# Patient Record
Sex: Female | Born: 1985 | Race: White | Hispanic: No | Marital: Married | Smoking: Never smoker
Health system: Southern US, Community
[De-identification: ages and names within clinical notes are randomized; demographics above are authoritative.]

## PROBLEM LIST (undated history)

## (undated) ENCOUNTER — Inpatient Hospital Stay (HOSPITAL_COMMUNITY): Payer: Self-pay

## (undated) DIAGNOSIS — J45909 Unspecified asthma, uncomplicated: Secondary | ICD-10-CM

## (undated) DIAGNOSIS — L509 Urticaria, unspecified: Secondary | ICD-10-CM

## (undated) HISTORY — PX: WISDOM TOOTH EXTRACTION: SHX21

## (undated) HISTORY — DX: Urticaria, unspecified: L50.9

## (undated) HISTORY — PX: BREAST SURGERY: SHX581

## (undated) HISTORY — DX: Unspecified asthma, uncomplicated: J45.909

---

## 2004-03-19 HISTORY — PX: AUGMENTATION MAMMAPLASTY: SUR837

## 2014-03-15 ENCOUNTER — Emergency Department: Payer: Self-pay | Admitting: Emergency Medicine

## 2014-03-15 LAB — COMPREHENSIVE METABOLIC PANEL
ALK PHOS: 63 U/L
ANION GAP: 8 (ref 7–16)
Albumin: 4.1 g/dL (ref 3.4–5.0)
BUN: 12 mg/dL (ref 7–18)
Bilirubin,Total: 0.3 mg/dL (ref 0.2–1.0)
CALCIUM: 9 mg/dL (ref 8.5–10.1)
CO2: 26 mmol/L (ref 21–32)
Chloride: 104 mmol/L (ref 98–107)
Creatinine: 0.6 mg/dL (ref 0.60–1.30)
EGFR (African American): >60
Glucose: 101 mg/dL — ABNORMAL HIGH (ref 65–99)
Osmolality: 276 (ref 275–301)
Potassium: 3.6 mmol/L (ref 3.5–5.1)
SGOT(AST): 25 U/L (ref 15–37)
SGPT (ALT): 33 U/L
Sodium: 138 mmol/L (ref 136–145)
TOTAL PROTEIN: 7.8 g/dL (ref 6.4–8.2)

## 2014-03-15 LAB — CBC WITH DIFFERENTIAL/PLATELET
Basophil #: 0 10*3/uL (ref 0.0–0.1)
Basophil %: 0.3 %
Eosinophil #: 0 10*3/uL (ref 0.0–0.7)
Eosinophil %: 0.1 %
HCT: 40.6 % (ref 35.0–47.0)
HGB: 13.6 g/dL (ref 12.0–16.0)
Lymphocyte #: 0.9 10*3/uL — ABNORMAL LOW (ref 1.0–3.6)
Lymphocyte %: 5.4 %
MCH: 30.9 pg (ref 26.0–34.0)
MCHC: 33.7 g/dL (ref 32.0–36.0)
MCV: 92 fL (ref 80–100)
Monocyte #: 1.5 x10 3/mm — ABNORMAL HIGH (ref 0.2–0.9)
Monocyte %: 9.3 %
Neutrophil #: 13.5 10*3/uL — ABNORMAL HIGH (ref 1.4–6.5)
Neutrophil %: 84.9 %
Platelet: 273 10*3/uL (ref 150–440)
RBC: 4.42 10*6/uL (ref 3.80–5.20)
RDW: 11.7 % (ref 11.5–14.5)
WBC: 15.9 10*3/uL — ABNORMAL HIGH (ref 3.6–11.0)

## 2014-03-15 LAB — URINALYSIS, COMPLETE
Bilirubin,UR: NEGATIVE
Blood: NEGATIVE
Glucose,UR: NEGATIVE mg/dL (ref 0–75)
Leukocyte Esterase: NEGATIVE
Nitrite: NEGATIVE
Ph: 5 (ref 4.5–8.0)
Protein: NEGATIVE
RBC,UR: 1 /HPF (ref 0–5)
Specific Gravity: 1.027 (ref 1.003–1.030)
Squamous Epithelial: 1
WBC UR: 4 /HPF (ref 0–5)

## 2014-03-15 LAB — HCG, QUANTITATIVE, PREGNANCY: Beta Hcg, Quant.: 43597 m[IU]/mL — ABNORMAL HIGH

## 2014-03-19 NOTE — L&D Delivery Note (Addendum)
Patient was C/C/+2 and pushed for 20 minutes with epidural.   NSVD  female infant, Apgars 8,9, weight P.   Mild shoulder dystocia relieved immediately with McRoberts and suprapubic pressure in less than 20 sec. The patient had a partial 3rd degree midline perineal laceration repaired with 2-0 vicryl R. Fundus was firm. EBL was expected amount my estimate 100cc.  Placenta was delivered intact. Vagina was clear.  Baby was vigorous and doing skin to skin with mother.  Hanifah Royse A

## 2014-04-02 LAB — OB RESULTS CONSOLE GC/CHLAMYDIA
Chlamydia: NEGATIVE
GC PROBE AMP, GENITAL: NEGATIVE

## 2014-04-30 LAB — OB RESULTS CONSOLE ABO/RH: RH Type: POSITIVE

## 2014-04-30 LAB — OB RESULTS CONSOLE HEPATITIS B SURFACE ANTIGEN: Hepatitis B Surface Ag: NEGATIVE

## 2014-04-30 LAB — OB RESULTS CONSOLE RPR: RPR: NONREACTIVE

## 2014-04-30 LAB — OB RESULTS CONSOLE RUBELLA ANTIBODY, IGM: Rubella: IMMUNE

## 2014-04-30 LAB — OB RESULTS CONSOLE ANTIBODY SCREEN: ANTIBODY SCREEN: NEGATIVE

## 2014-04-30 LAB — OB RESULTS CONSOLE HIV ANTIBODY (ROUTINE TESTING): HIV: NONREACTIVE

## 2014-09-14 ENCOUNTER — Observation Stay (HOSPITAL_COMMUNITY)
Admission: AD | Admit: 2014-09-14 | Discharge: 2014-09-16 | Disposition: A | Discharge status: Home Health | Payer: Managed Care, Other (non HMO) | Source: Ambulatory Visit | Attending: Obstetrics and Gynecology | Admitting: Obstetrics and Gynecology

## 2014-09-14 ENCOUNTER — Encounter (HOSPITAL_COMMUNITY): Payer: Self-pay | Admitting: *Deleted

## 2014-09-14 DIAGNOSIS — Z3A32 32 weeks gestation of pregnancy: Secondary | ICD-10-CM | POA: Insufficient documentation

## 2014-09-14 LAB — T4, FREE: FREE T4: 0.71 ng/dL (ref 0.61–1.12)

## 2014-09-14 LAB — URINALYSIS, ROUTINE W REFLEX MICROSCOPIC
Bilirubin Urine: NEGATIVE
GLUCOSE, UA: NEGATIVE mg/dL
HGB URINE DIPSTICK: NEGATIVE
Ketones, ur: NEGATIVE mg/dL
Leukocytes, UA: NEGATIVE
Nitrite: NEGATIVE
PH: 6 (ref 5.0–8.0)
Protein, ur: NEGATIVE mg/dL
Urobilinogen, UA: 0.2 mg/dL (ref 0.0–1.0)

## 2014-09-14 LAB — TSH: TSH: 1.202 u[IU]/mL (ref 0.350–4.500)

## 2014-09-14 MED ORDER — ZOLPIDEM TARTRATE 5 MG PO TABS: 5.0000 mg | ORAL_TABLET | Freq: Every evening | ORAL | Status: DC | PRN

## 2014-09-14 MED ORDER — FOLIC ACID 1 MG PO TABS
1.0000 mg | ORAL_TABLET | Freq: Every day | ORAL | Status: DC
  Administered 2014-09-15 – 2014-09-16 (×2): 1 mg via ORAL
  Filled 2014-09-14 (×3): qty 1

## 2014-09-14 MED ORDER — DOCUSATE SODIUM 100 MG PO CAPS
100.0000 mg | ORAL_CAPSULE | Freq: Every day | ORAL | Status: DC
  Administered 2014-09-15 – 2014-09-16 (×2): 100 mg via ORAL
  Filled 2014-09-14 (×2): qty 1

## 2014-09-14 MED ORDER — LACTATED RINGERS IV BOLUS (SEPSIS)
1000.0000 mL | Freq: Once | INTRAVENOUS | Status: AC
  Administered 2014-09-14: 1000 mL via INTRAVENOUS

## 2014-09-14 MED ORDER — LACTATED RINGERS IV SOLN
INTRAVENOUS | Status: DC
  Administered 2014-09-14: 23:00:00 via INTRAVENOUS

## 2014-09-14 MED ORDER — SODIUM CHLORIDE 0.9 % IV SOLN
250.0000 mL | INTRAVENOUS | Status: DC | PRN
  Administered 2014-09-14: 250 mL via INTRAVENOUS

## 2014-09-14 MED ORDER — FAMOTIDINE 20 MG PO TABS
20.0000 mg | ORAL_TABLET | Freq: Two times a day (BID) | ORAL | Status: DC
  Administered 2014-09-15 – 2014-09-16 (×4): 20 mg via ORAL
  Filled 2014-09-14 (×5): qty 1

## 2014-09-14 MED ORDER — PRENATAL MULTIVITAMIN CH
1.0000 | ORAL_TABLET | Freq: Every day | ORAL | Status: DC
  Administered 2014-09-15 – 2014-09-16 (×2): 1 via ORAL
  Filled 2014-09-14 (×2): qty 1

## 2014-09-14 MED ORDER — CALCIUM CARBONATE ANTACID 500 MG PO CHEW: 2.0000 | CHEWABLE_TABLET | ORAL | Status: DC | PRN

## 2014-09-14 MED ORDER — MAGNESIUM SULFATE BOLUS VIA INFUSION
4.0000 g | Freq: Once | INTRAVENOUS | Status: AC
  Administered 2014-09-14: 4 g via INTRAVENOUS
  Filled 2014-09-14: qty 500

## 2014-09-14 MED ORDER — NIFEDIPINE 10 MG PO CAPS
10.0000 mg | ORAL_CAPSULE | ORAL | Status: DC | PRN
  Administered 2014-09-14 (×3): 10 mg via ORAL
  Filled 2014-09-14 (×4): qty 1

## 2014-09-14 MED ORDER — SODIUM CHLORIDE 0.9 % IJ SOLN: 3.0000 mL | Freq: Two times a day (BID) | INTRAMUSCULAR | Status: DC

## 2014-09-14 MED ORDER — SODIUM CHLORIDE 0.9 % IJ SOLN: 3.0000 mL | INTRAMUSCULAR | Status: DC | PRN

## 2014-09-14 MED ORDER — BETAMETHASONE SOD PHOS & ACET 6 (3-3) MG/ML IJ SUSP
12.0000 mg | Freq: Once | INTRAMUSCULAR | Status: AC
  Administered 2014-09-14: 12 mg via INTRAMUSCULAR
  Filled 2014-09-14: qty 2

## 2014-09-14 MED ORDER — ACETAMINOPHEN 325 MG PO TABS: 650.0000 mg | ORAL_TABLET | ORAL | Status: DC | PRN

## 2014-09-14 MED ORDER — BETAMETHASONE SOD PHOS & ACET 6 (3-3) MG/ML IJ SUSP
12.0000 mg | Freq: Once | INTRAMUSCULAR | Status: AC
  Administered 2014-09-15: 12 mg via INTRAMUSCULAR
  Filled 2014-09-14: qty 2

## 2014-09-14 MED ORDER — MAGNESIUM SULFATE 50 % IJ SOLN
2.0000 g/h | INTRAVENOUS | Status: DC
  Administered 2014-09-14: 2 g/h via INTRAVENOUS
  Filled 2014-09-14: qty 80

## 2014-09-14 NOTE — H&P (Signed)
29 y.o. [redacted]w[redacted]d  G2P0010 comes in c/o some tightening.  Otherwise has good fetal movement and no bleeding.  She has not had LOF.  Pt was working today and stated that her abdomen felt really tight at times.  She didn't think it was happening with a pattern but did feel like it was intermittent.  I checked her in the office and found her to be 1/50/-2 (loose one.)  She presented to MAU and stated initially that the contractions decreased.  However, at about 6 pm she began contracting every 4 minutes and now they are as frequent as every 2 minutes.  She feels them but they are not worsening in intensity.  History reviewed. No pertinent past medical history.  Past Surgical History  Procedure Laterality Date  . Breast surgery      OB History  Gravida Para Term Preterm AB SAB TAB Ectopic Multiple Living  2    1   1   0    # Outcome Date GA Lbr Len/2nd Weight Sex Delivery Anes PTL Lv  2 Current           1 Ectopic               History   Social History  . Marital Status: Married    Spouse Name: N/A  . Number of Children: N/A  . Years of Education: N/A   Occupational History  . Not on file.   Social History Main Topics  . Smoking status: Never Smoker   . Smokeless tobacco: Not on file  . Alcohol Use: No  . Drug Use: No  . Sexual Activity: Not on file   Other Topics Concern  . Not on file   Social History Narrative  . No narrative on file   Review of patient's allergies indicates no known allergies.    Prenatal Transfer Tool  Maternal Diabetes: No Genetic Screening: Normal Maternal Ultrasounds/Referrals: Normal Fetal Ultrasounds or other Referrals:  None Maternal Substance Abuse:  No Significant Maternal Medications:  None Significant Maternal Lab Results: None  Other PNC: uncomplicated.    Filed Vitals:   09/14/14 1843  BP: 125/77  Pulse:   Temp:   Resp:      Lungs/Cor:  NAD Abdomen:  soft, gravid Ex:  no cords, erythema SVE:  1/50/-2, VTX - no change from  exam over 2 hours ago. FHTs:  120s, good STV, NST R- cat 1 Toco:  q no q 2-3 minutes   A/P   G2P0010 [redacted]w[redacted]d with preterm contractions and cervix open to one; no current cervical change.  Will admit for obs overnight.    GBS P- will draw.  Procardia given to decrease contractions- if cervical change or continued contractions, will add magnesium sulfate.  Pt has gotten her first dose of BMZ in MAU.  Recent US in office last Friday showed EFW 4#1, 60%ile, good fluid, VTX.  Mina Carlisi A

## 2014-09-14 NOTE — MAU Note (Signed)
Was at work today, felt abd was tightening up. Contractions were noted on the monitor.  1/50 per Dr Philis Pique.  Sent over for monitoring and Betamethasone.  Denies bleeding or leaking.

## 2014-09-15 LAB — CBC
HCT: 34.2 % — ABNORMAL LOW (ref 36.0–46.0)
HEMOGLOBIN: 11.9 g/dL — AB (ref 12.0–15.0)
MCH: 31.4 pg (ref 26.0–34.0)
MCHC: 34.8 g/dL (ref 30.0–36.0)
MCV: 90.2 fL (ref 78.0–100.0)
PLATELETS: 214 10*3/uL (ref 150–400)
RBC: 3.79 MIL/uL — AB (ref 3.87–5.11)
RDW: 12.7 % (ref 11.5–15.5)
WBC: 15.6 10*3/uL — ABNORMAL HIGH (ref 4.0–10.5)

## 2014-09-15 LAB — RPR: RPR Ser Ql: NONREACTIVE

## 2014-09-15 LAB — TYPE AND SCREEN
ABO/RH(D): A POS
Antibody Screen: NEGATIVE

## 2014-09-15 LAB — ABO/RH: ABO/RH(D): A POS

## 2014-09-15 MED ORDER — DIPHENHYDRAMINE HCL 25 MG PO CAPS
25.0000 mg | ORAL_CAPSULE | Freq: Every evening | ORAL | Status: DC | PRN
  Administered 2014-09-15: 25 mg via ORAL
  Filled 2014-09-15: qty 1

## 2014-09-15 MED ORDER — NIFEDIPINE 10 MG PO CAPS: 10.0000 mg | ORAL_CAPSULE | ORAL | Status: DC | PRN

## 2014-09-15 MED ORDER — NIFEDIPINE 10 MG PO CAPS
10.0000 mg | ORAL_CAPSULE | Freq: Four times a day (QID) | ORAL | Status: DC | PRN
  Administered 2014-09-16: 10 mg via ORAL
  Filled 2014-09-15: qty 1

## 2014-09-15 NOTE — Discharge Summary (Signed)
Physician Discharge Summary  Patient ID: Jakyah Bradby MRN: [id number] DOB/AGE: May 12, 1985 29 y.o.  Admit date: 09/14/2014 Discharge date: 09/15/2014  Admission Diagnoses:Preterm labor  Discharge Diagnoses: same Active Problems:   Preterm labor in third trimester without delivery   Discharged Condition: good  Hospital Course: Uncomplicated course of BMZ and contractions stopped after MgSO4 administered.    Consults: neonatology  Significant Diagnostic Studies: labs:   Results for orders placed or performed during the hospital encounter of 09/14/14 (from the past 24 hour(s))  Urinalysis, Routine w reflex microscopic (not at [facility])     Status: Abnormal   Collection Time: 09/14/14  5:15 PM  Result Value Ref Range   Color, Urine YELLOW YELLOW   APPearance CLEAR CLEAR   Specific Gravity, Urine <1.005 (L) 1.005 - 1.030   pH 6.0 5.0 - 8.0   Glucose, UA NEGATIVE NEGATIVE mg/dL   Hgb urine dipstick NEGATIVE NEGATIVE   Bilirubin Urine NEGATIVE NEGATIVE   Ketones, ur NEGATIVE NEGATIVE mg/dL   Protein, ur NEGATIVE NEGATIVE mg/dL   Urobilinogen, UA 0.2 0.0 - 1.0 mg/dL   Nitrite NEGATIVE NEGATIVE   Leukocytes, UA NEGATIVE NEGATIVE  T4, free     Status: None   Collection Time: 09/14/14  8:40 PM  Result Value Ref Range   Free T4 0.71 0.61 - 1.12 ng/dL  TSH     Status: None   Collection Time: 09/14/14  8:40 PM  Result Value Ref Range   TSH 1.202 0.350 - 4.500 uIU/mL  CBC     Status: Abnormal   Collection Time: 09/14/14  8:40 PM  Result Value Ref Range   WBC 15.6 (H) 4.0 - 10.5 K/uL   RBC 3.79 (L) 3.87 - 5.11 MIL/uL   Hemoglobin 11.9 (L) 12.0 - 15.0 g/dL   HCT 34.2 (L) 36.0 - 46.0 %   MCV 90.2 78.0 - 100.0 fL   MCH 31.4 26.0 - 34.0 pg   MCHC 34.8 30.0 - 36.0 g/dL   RDW 12.7 11.5 - 15.5 %   Platelets 214 150 - 400 K/uL  Type and screen     Status: None   Collection Time: 09/14/14  8:40 PM  Result Value Ref Range   ABO/RH(D) A POS    Antibody Screen NEG    Sample  Expiration 09/17/2014   ABO/Rh     Status: None   Collection Time: 09/14/14  8:40 PM  Result Value Ref Range   ABO/RH(D) A POS    GBS pending. Treatments: IV hydration, Magnesium sulfate, BMZ  Discharge Exam: Blood pressure 110/55, pulse 96, temperature 98.1 F (36.7 C), temperature source Oral, resp. rate 18, height 5\' 4"  (1.626 m), weight 70.308 kg (155 lb).   Disposition: Final discharge disposition not confirmed     Medication List    TAKE these medications        diphenhydramine-acetaminophen 25-500 MG Tabs  Commonly known as:  TYLENOL PM  Take 1 tablet by mouth at bedtime as needed (sleep).     famotidine 20 MG tablet  Commonly known as:  PEPCID  Take 20 mg by mouth 2 (two) times daily.     folic acid 416 MCG tablet  Commonly known as:  FOLVITE  Take 400 mcg by mouth daily.     NIFEdipine 10 MG capsule  Commonly known as:  PROCARDIA  Take 1 capsule (10 mg total) by mouth every 20 (twenty) minutes as needed (for greater than 5 contractions per hour).     prenatal multivitamin Tabs  tablet  Take 1 tablet by mouth daily at 12 noon.           Follow-up Information    Follow up with Katena Petitjean A, MD In 4 weeks.   Specialty:  Obstetrics and Gynecology   Contact information:   [address] [place] [address] [phone]       Signed: Ashe Graybeal A 09/15/2014, 1:52 PM

## 2014-09-15 NOTE — Progress Notes (Signed)
Pt had an increase of contractions even after the procardia to 6 in 30 minutes.   She was feeling them as well.  Mg SO4 started to prevent delivery to get BMZ on board.    FHTs now 140s gSTV and NST R Toco now q 8-10 and pt more comfortable  Results for orders placed or performed during the hospital encounter of 09/14/14 (from the past 24 hour(s))  Urinalysis, Routine w reflex microscopic (not at [facility])     Status: Abnormal   Collection Time: 09/14/14  5:15 PM  Result Value Ref Range   Color, Urine YELLOW YELLOW   APPearance CLEAR CLEAR   Specific Gravity, Urine <1.005 (L) 1.005 - 1.030   pH 6.0 5.0 - 8.0   Glucose, UA NEGATIVE NEGATIVE mg/dL   Hgb urine dipstick NEGATIVE NEGATIVE   Bilirubin Urine NEGATIVE NEGATIVE   Ketones, ur NEGATIVE NEGATIVE mg/dL   Protein, ur NEGATIVE NEGATIVE mg/dL   Urobilinogen, UA 0.2 0.0 - 1.0 mg/dL   Nitrite NEGATIVE NEGATIVE   Leukocytes, UA NEGATIVE NEGATIVE  T4, free     Status: None   Collection Time: 09/14/14  8:40 PM  Result Value Ref Range   Free T4 0.71 0.61 - 1.12 ng/dL  TSH     Status: None   Collection Time: 09/14/14  8:40 PM  Result Value Ref Range   TSH 1.202 0.350 - 4.500 uIU/mL  CBC     Status: Abnormal   Collection Time: 09/14/14  8:40 PM  Result Value Ref Range   WBC 15.6 (H) 4.0 - 10.5 K/uL   RBC 3.79 (L) 3.87 - 5.11 MIL/uL   Hemoglobin 11.9 (L) 12.0 - 15.0 g/dL   HCT 34.2 (L) 36.0 - 46.0 %   MCV 90.2 78.0 - 100.0 fL   MCH 31.4 26.0 - 34.0 pg   MCHC 34.8 30.0 - 36.0 g/dL   RDW 12.7 11.5 - 15.5 %   Platelets 214 150 - 400 K/uL  Type and screen     Status: None (Preliminary result)   Collection Time: 09/14/14  8:40 PM  Result Value Ref Range   ABO/RH(D) A POS    Antibody Screen PENDING    Sample Expiration 09/17/2014     Will do magnesium for 24 hours.

## 2014-09-15 NOTE — Progress Notes (Signed)
29 y.o. G2P0010 [redacted]w[redacted]d HD# admitted for 32wks ctx.  Pt currently stable with no c/o contractions except for 2 over last couple hours.  Good FM.  Filed Vitals:   09/15/14 1000 09/15/14 1100 09/15/14 1201 09/15/14 1300  BP:   110/55   Pulse:   96   Temp:   98.1 F (36.7 C)   TempSrc:   Oral   Resp: 20 18 18 18   Height:      Weight:       Lungs CTA Cor RRR Abd  Soft, gravid, nontender Ex SCDs FHTs  120s, good short term variability, NST R Toco  occ SVE now tight 1/40/-2   Results for orders placed or performed during the hospital encounter of 09/14/14 (from the past 24 hour(s))  Urinalysis, Routine w reflex microscopic (not at [facility])     Status: Abnormal   Collection Time: 09/14/14  5:15 PM  Result Value Ref Range   Color, Urine YELLOW YELLOW   APPearance CLEAR CLEAR   Specific Gravity, Urine <1.005 (L) 1.005 - 1.030   pH 6.0 5.0 - 8.0   Glucose, UA NEGATIVE NEGATIVE mg/dL   Hgb urine dipstick NEGATIVE NEGATIVE   Bilirubin Urine NEGATIVE NEGATIVE   Ketones, ur NEGATIVE NEGATIVE mg/dL   Protein, ur NEGATIVE NEGATIVE mg/dL   Urobilinogen, UA 0.2 0.0 - 1.0 mg/dL   Nitrite NEGATIVE NEGATIVE   Leukocytes, UA NEGATIVE NEGATIVE  T4, free     Status: None   Collection Time: 09/14/14  8:40 PM  Result Value Ref Range   Free T4 0.71 0.61 - 1.12 ng/dL  TSH     Status: None   Collection Time: 09/14/14  8:40 PM  Result Value Ref Range   TSH 1.202 0.350 - 4.500 uIU/mL  CBC     Status: Abnormal   Collection Time: 09/14/14  8:40 PM  Result Value Ref Range   WBC 15.6 (H) 4.0 - 10.5 K/uL   RBC 3.79 (L) 3.87 - 5.11 MIL/uL   Hemoglobin 11.9 (L) 12.0 - 15.0 g/dL   HCT 34.2 (L) 36.0 - 46.0 %   MCV 90.2 78.0 - 100.0 fL   MCH 31.4 26.0 - 34.0 pg   MCHC 34.8 30.0 - 36.0 g/dL   RDW 12.7 11.5 - 15.5 %   Platelets 214 150 - 400 K/uL  Type and screen     Status: None   Collection Time: 09/14/14  8:40 PM  Result Value Ref Range   ABO/RH(D) A POS    Antibody Screen NEG    Sample  Expiration 09/17/2014   ABO/Rh     Status: None   Collection Time: 09/14/14  8:40 PM  Result Value Ref Range   ABO/RH(D) A POS     A:  HD#  [redacted]w[redacted]d with preterm labor.  P: On MgSO4.  Second BMZ due at 17:45.  Will d/c magnesium at 1600 to see how contractions are.    Delante Karapetyan A

## 2014-09-15 NOTE — Consult Note (Signed)
Neonatology Consult  Note:  At the request of the patients obstetrician Dr. Philis Pique I met with Kelly [name] and her husband.  She presented at 32 1 weeks with preterm labor.   Pregnancy otherwise uncomplicated.  She is undergoing tocolysis with magnesium sulfate and received her first dose of betamethasone yesterday.       We reviewed initial delivery room management, including CPAP, Gardners, and low but certainly possible need for intubation for surfactant administration.  We discussed feeding immaturity and need for full po intake with multiple days of good weight gain and no apnea or bradycardia before discharge.  We reviewed increased risk of jaundice, infection, and temperature instability.   Discussed likely length of stay.  Thank you for allowing [place] to participate in her care.   Higinio Roger, DO  Neonatologist  The total length of face-to-face or floor / unit time for this encounter was 25 minutes.  Counseling and / or coordination of care was greater than fifty percent of the time.

## 2014-09-16 ENCOUNTER — Observation Stay (HOSPITAL_COMMUNITY): Payer: Managed Care, Other (non HMO)

## 2014-09-16 DIAGNOSIS — Z3A32 32 weeks gestation of pregnancy: Secondary | ICD-10-CM | POA: Insufficient documentation

## 2014-09-16 LAB — CULTURE, BETA STREP (GROUP B ONLY)

## 2014-09-16 LAB — FETAL FIBRONECTIN: Fetal Fibronectin: NEGATIVE

## 2014-09-16 NOTE — Progress Notes (Signed)
Patient ID: Kelly [name], female   DOB: 1986-01-06, 29 y.o.   MRN: [id number]  S: Feeling some uterine irritability, active fetal movement O:  Filed Vitals:   09/15/14 1956 09/16/14 0832 09/16/14 1108 09/16/14 1109  BP: 109/61 100/55  114/67  Pulse: 87 78  81  Temp: 98.6 F (37 C) 98.5 F (36.9 C) 98.9 F (37.2 C)   TempSrc: Axillary Oral Oral   Resp: 18 18 18    Height:      Weight:       AOX3, NAD cvx 1/50/-2, posterior FHR 140, reactive, category 1 tracing toco irritability with Q30 min  FFN collected and at bedside  A/P 1) Will check TVUS. If CL >2.5cm will discard FFN. If less than 2.5 will send. Will likely d/c home regardless of results of FFN. 2) FWB reassuring

## 2014-10-05 LAB — OB RESULTS CONSOLE GBS: GBS: NEGATIVE

## 2014-10-25 ENCOUNTER — Inpatient Hospital Stay (HOSPITAL_COMMUNITY): Payer: Managed Care, Other (non HMO) | Admitting: Anesthesiology

## 2014-10-25 ENCOUNTER — Inpatient Hospital Stay (HOSPITAL_COMMUNITY)
Admission: AD | Admit: 2014-10-25 | Discharge: 2014-10-28 | DRG: 988 | Disposition: A | Discharge status: Home / Self Care | Payer: Managed Care, Other (non HMO) | Source: Ambulatory Visit | Attending: Obstetrics and Gynecology | Admitting: Obstetrics and Gynecology

## 2014-10-25 ENCOUNTER — Encounter (HOSPITAL_COMMUNITY): Payer: Self-pay | Admitting: *Deleted

## 2014-10-25 DIAGNOSIS — Z3A38 38 weeks gestation of pregnancy: Secondary | ICD-10-CM | POA: Diagnosis present

## 2014-10-25 LAB — TYPE AND SCREEN
ABO/RH(D): A POS
Antibody Screen: NEGATIVE

## 2014-10-25 LAB — CBC
HEMATOCRIT: 35.9 % — AB (ref 36.0–46.0)
Hemoglobin: 12.3 g/dL (ref 12.0–15.0)
MCH: 31.2 pg (ref 26.0–34.0)
MCHC: 34.3 g/dL (ref 30.0–36.0)
MCV: 91.1 fL (ref 78.0–100.0)
PLATELETS: 210 10*3/uL (ref 150–400)
RBC: 3.94 MIL/uL (ref 3.87–5.11)
RDW: 12.7 % (ref 11.5–15.5)
WBC: 9.1 10*3/uL (ref 4.0–10.5)

## 2014-10-25 MED ORDER — ACETAMINOPHEN 325 MG PO TABS: 650.0000 mg | ORAL_TABLET | ORAL | Status: DC | PRN

## 2014-10-25 MED ORDER — FLEET ENEMA 7-19 GM/118ML RE ENEM: 1.0000 | ENEMA | RECTAL | Status: DC | PRN

## 2014-10-25 MED ORDER — OXYTOCIN 40 UNITS IN LACTATED RINGERS INFUSION - SIMPLE MED
62.5000 mL/h | INTRAVENOUS | Status: DC
  Filled 2014-10-25: qty 1000

## 2014-10-25 MED ORDER — LACTATED RINGERS IV SOLN
INTRAVENOUS | Status: DC
  Administered 2014-10-25 (×3): via INTRAVENOUS

## 2014-10-25 MED ORDER — FENTANYL 2.5 MCG/ML BUPIVACAINE 1/10 % EPIDURAL INFUSION (WH - ANES)
INTRAMUSCULAR | Status: DC | PRN
  Administered 2014-10-25: 14 mL/h via EPIDURAL

## 2014-10-25 MED ORDER — ONDANSETRON HCL 4 MG/2ML IJ SOLN
4.0000 mg | Freq: Four times a day (QID) | INTRAMUSCULAR | Status: DC | PRN
  Administered 2014-10-25: 4 mg via INTRAVENOUS
  Filled 2014-10-25: qty 2

## 2014-10-25 MED ORDER — OXYCODONE-ACETAMINOPHEN 5-325 MG PO TABS: 2.0000 | ORAL_TABLET | ORAL | Status: DC | PRN

## 2014-10-25 MED ORDER — OXYTOCIN 40 UNITS IN LACTATED RINGERS INFUSION - SIMPLE MED
1.0000 m[IU]/min | INTRAVENOUS | Status: DC
  Administered 2014-10-25: 2 m[IU]/min via INTRAVENOUS

## 2014-10-25 MED ORDER — EPHEDRINE 5 MG/ML INJ
10.0000 mg | INTRAVENOUS | Status: DC | PRN
  Filled 2014-10-25: qty 2

## 2014-10-25 MED ORDER — TERBUTALINE SULFATE 1 MG/ML IJ SOLN
0.2500 mg | Freq: Once | INTRAMUSCULAR | Status: DC | PRN
  Filled 2014-10-25: qty 1

## 2014-10-25 MED ORDER — OXYCODONE-ACETAMINOPHEN 5-325 MG PO TABS: 1.0000 | ORAL_TABLET | ORAL | Status: DC | PRN

## 2014-10-25 MED ORDER — LACTATED RINGERS IV SOLN
500.0000 mL | INTRAVENOUS | Status: DC | PRN
  Administered 2014-10-25: 500 mL via INTRAVENOUS

## 2014-10-25 MED ORDER — CITRIC ACID-SODIUM CITRATE 334-500 MG/5ML PO SOLN: 30.0000 mL | ORAL | Status: DC | PRN

## 2014-10-25 MED ORDER — LIDOCAINE HCL (PF) 1 % IJ SOLN
30.0000 mL | INTRAMUSCULAR | Status: DC | PRN
  Filled 2014-10-25: qty 30

## 2014-10-25 MED ORDER — LIDOCAINE HCL (PF) 1 % IJ SOLN
INTRAMUSCULAR | Status: DC | PRN
  Administered 2014-10-25 (×2): 5 mL

## 2014-10-25 MED ORDER — FENTANYL 2.5 MCG/ML BUPIVACAINE 1/10 % EPIDURAL INFUSION (WH - ANES)
14.0000 mL/h | INTRAMUSCULAR | Status: DC | PRN
  Administered 2014-10-25: 14 mL/h via EPIDURAL
  Filled 2014-10-25: qty 125

## 2014-10-25 MED ORDER — PHENYLEPHRINE 40 MCG/ML (10ML) SYRINGE FOR IV PUSH (FOR BLOOD PRESSURE SUPPORT)
80.0000 ug | PREFILLED_SYRINGE | INTRAVENOUS | Status: DC | PRN
  Filled 2014-10-25: qty 20
  Filled 2014-10-25: qty 2

## 2014-10-25 MED ORDER — OXYTOCIN BOLUS FROM INFUSION
500.0000 mL | INTRAVENOUS | Status: DC
  Administered 2014-10-26: 500 mL via INTRAVENOUS

## 2014-10-25 MED ORDER — DIPHENHYDRAMINE HCL 50 MG/ML IJ SOLN: 12.5000 mg | INTRAMUSCULAR | Status: DC | PRN

## 2014-10-25 MED ORDER — FENTANYL 2.5 MCG/ML BUPIVACAINE 1/10 % EPIDURAL INFUSION (WH - ANES)
14.0000 mL/h | INTRAMUSCULAR | Status: DC | PRN
Start: 2014-10-25 — End: 2014-10-25

## 2014-10-25 NOTE — Anesthesia Procedure Notes (Signed)
Epidural Patient location during procedure: OB Start time: 10/25/2014 5:07 PM End time: 10/25/2014 5:22 PM  Staffing Anesthesiologist: Alexis Frock  Preanesthetic Checklist Completed: patient identified, site marked, surgical consent, pre-op evaluation, timeout performed, IV checked, risks and benefits discussed and monitors and equipment checked  Epidural Patient position: sitting Prep: site prepped and draped and DuraPrep Patient monitoring: heart rate, continuous pulse ox and blood pressure Approach: midline Location: L3-L4 Injection technique: LOR air  Needle:  Needle type: Tuohy  Needle gauge: 17 G Needle length: 9 cm and 9 Needle insertion depth: 6 cm Catheter type: closed end flexible Catheter size: 19 Gauge Test dose: negative  Assessment Events: blood not aspirated, injection not painful, no injection resistance, negative IV test and no paresthesia  Additional Notes   Patient tolerated the insertion well without complications.Reason for block:procedure for pain

## 2014-10-25 NOTE — Plan of Care (Signed)
Problem: Consults Goal: Birthing Suites Patient Information Press F2 to bring up selections list   Pt 37-[redacted] weeks EGA     

## 2014-10-25 NOTE — H&P (Addendum)
29 y.o. [redacted]w[redacted]d  G2P0010 comes in c/o labor.  Otherwise has good fetal movement and no bleeding.  History reviewed. No pertinent past medical history.  Past Surgical History  Procedure Laterality Date  . Breast surgery      OB History  Gravida Para Term Preterm AB SAB TAB Ectopic Multiple Living  2    1   1   0    # Outcome Date GA Lbr Len/2nd Weight Sex Delivery Anes PTL Lv  2 Current           1 Ectopic               History   Social History  . Marital Status: Married    Spouse Name: N/[name]  . Number of Children: N/[name]  . Years of Education: N/[name]   Occupational History  . Not on file.   Social History Main Topics  . Smoking status: Never Smoker   . Smokeless tobacco: Not on file  . Alcohol Use: No  . Drug Use: No  . Sexual Activity: Not on file   Other Topics Concern  . Not on file   Social History Narrative   Review of patient's allergies indicates no known allergies.    Prenatal Transfer Tool  Maternal Diabetes: No Genetic Screening: Normal Maternal Ultrasounds/Referrals: Normal Fetal Ultrasounds or other Referrals:  None Maternal Substance Abuse:  No Significant Maternal Medications:  None Significant Maternal Lab Results: None  Other PNC: at 32 weeks she was admitted for preterm labor and was given BMZ and MgSO4.  She was d/ced after 3 days and had no further comp.    Filed Vitals:   10/25/14 2326 10/25/14 2331 10/26/14 0000 10/26/14 0031  BP:  100/58 141/74 146/57  Pulse:  81 94 100  Temp: 99.3 F (37.4 C)     TempSrc: Oral     Resp:  20 20 20   Height:      Weight:      SpO2:        Lungs/Cor:  NAD Abdomen:  soft, gravid Ex:  no cords, erythema SVE:  6/C/-1, AROM clear FHTs:  140s, good STV, NST R Toco:  q2-4   [name]/P   Term labor.  GBS neg.  Kelly [name]

## 2014-10-25 NOTE — Anesthesia Preprocedure Evaluation (Signed)
Anesthesia Evaluation  Patient identified by MRN, date of birth, ID band Patient awake and Patient confused    Reviewed: Allergy & Precautions, H&P , NPO status , Patient's Chart, lab work & pertinent test results  Airway Mallampati: II       Dental   Pulmonary  breath sounds clear to auscultation  Pulmonary exam normal       Cardiovascular Exercise Tolerance: Good Normal cardiovascular examRhythm:regular Rate:Normal     Neuro/Psych    GI/Hepatic   Endo/Other    Renal/GU      Musculoskeletal   Abdominal   Peds  Hematology   Anesthesia Other Findings   Reproductive/Obstetrics (+) Pregnancy                             Anesthesia Physical Anesthesia Plan  ASA: II  Anesthesia Plan: Epidural   Post-op Pain Management:    Induction:   Airway Management Planned:   Additional Equipment:   Intra-op Plan:   Post-operative Plan:   Informed Consent: I have reviewed the patients History and Physical, chart, labs and discussed the procedure including the risks, benefits and alternatives for the proposed anesthesia with the patient or authorized representative who has indicated his/her understanding and acceptance.     Plan Discussed with:   Anesthesia Plan Comments:         Anesthesia Quick Evaluation  

## 2014-10-26 LAB — RPR: RPR: NONREACTIVE

## 2014-10-26 MED ORDER — ONDANSETRON HCL 4 MG PO TABS: 4.0000 mg | ORAL_TABLET | ORAL | Status: DC | PRN

## 2014-10-26 MED ORDER — IBUPROFEN 800 MG PO TABS
800.0000 mg | ORAL_TABLET | Freq: Three times a day (TID) | ORAL | Status: DC
  Administered 2014-10-26 – 2014-10-28 (×7): 800 mg via ORAL
  Filled 2014-10-26 (×7): qty 1

## 2014-10-26 MED ORDER — ACETAMINOPHEN 325 MG PO TABS: 650.0000 mg | ORAL_TABLET | ORAL | Status: DC | PRN

## 2014-10-26 MED ORDER — TETANUS-DIPHTH-ACELL PERTUSSIS 5-2.5-18.5 LF-MCG/0.5 IM SUSP: 0.5000 mL | Freq: Once | INTRAMUSCULAR | Status: DC

## 2014-10-26 MED ORDER — METHYLERGONOVINE MALEATE 0.2 MG/ML IJ SOLN: 0.2000 mg | INTRAMUSCULAR | Status: DC | PRN

## 2014-10-26 MED ORDER — SODIUM CHLORIDE 0.9 % IJ SOLN: 3.0000 mL | INTRAMUSCULAR | Status: DC | PRN

## 2014-10-26 MED ORDER — OXYCODONE-ACETAMINOPHEN 5-325 MG PO TABS
1.0000 | ORAL_TABLET | ORAL | Status: DC | PRN
  Administered 2014-10-26 – 2014-10-28 (×3): 1 via ORAL
  Filled 2014-10-26 (×3): qty 1

## 2014-10-26 MED ORDER — SIMETHICONE 80 MG PO CHEW: 80.0000 mg | CHEWABLE_TABLET | ORAL | Status: DC | PRN

## 2014-10-26 MED ORDER — DIPHENHYDRAMINE HCL 25 MG PO CAPS: 25.0000 mg | ORAL_CAPSULE | Freq: Four times a day (QID) | ORAL | Status: DC | PRN

## 2014-10-26 MED ORDER — METHYLERGONOVINE MALEATE 0.2 MG PO TABS: 0.2000 mg | ORAL_TABLET | ORAL | Status: DC | PRN

## 2014-10-26 MED ORDER — FERROUS SULFATE 325 (65 FE) MG PO TABS
325.0000 mg | ORAL_TABLET | Freq: Two times a day (BID) | ORAL | Status: DC
  Administered 2014-10-26 – 2014-10-28 (×4): 325 mg via ORAL
  Filled 2014-10-26 (×4): qty 1

## 2014-10-26 MED ORDER — WITCH HAZEL-GLYCERIN EX PADS: 1.0000 "application " | MEDICATED_PAD | CUTANEOUS | Status: DC | PRN

## 2014-10-26 MED ORDER — SENNOSIDES-DOCUSATE SODIUM 8.6-50 MG PO TABS
2.0000 | ORAL_TABLET | ORAL | Status: DC
  Administered 2014-10-27 (×2): 2 via ORAL
  Filled 2014-10-26 (×2): qty 2

## 2014-10-26 MED ORDER — ONDANSETRON HCL 4 MG/2ML IJ SOLN: 4.0000 mg | INTRAMUSCULAR | Status: DC | PRN

## 2014-10-26 MED ORDER — SODIUM CHLORIDE 0.9 % IV SOLN: 250.0000 mL | INTRAVENOUS | Status: DC | PRN

## 2014-10-26 MED ORDER — FAMOTIDINE 20 MG PO TABS
20.0000 mg | ORAL_TABLET | Freq: Two times a day (BID) | ORAL | Status: DC
  Administered 2014-10-26 – 2014-10-28 (×5): 20 mg via ORAL
  Filled 2014-10-26 (×5): qty 1

## 2014-10-26 MED ORDER — MEASLES, MUMPS & RUBELLA VAC ~~LOC~~ INJ: 0.5000 mL | INJECTION | Freq: Once | SUBCUTANEOUS | Status: DC

## 2014-10-26 MED ORDER — BENZOCAINE-MENTHOL 20-0.5 % EX AERO
1.0000 "application " | INHALATION_SPRAY | CUTANEOUS | Status: DC | PRN
  Administered 2014-10-26: 1 via TOPICAL
  Filled 2014-10-26: qty 56

## 2014-10-26 MED ORDER — DIBUCAINE 1 % RE OINT: 1.0000 "application " | TOPICAL_OINTMENT | RECTAL | Status: DC | PRN

## 2014-10-26 MED ORDER — LANOLIN HYDROUS EX OINT: TOPICAL_OINTMENT | CUTANEOUS | Status: DC | PRN

## 2014-10-26 MED ORDER — ZOLPIDEM TARTRATE 5 MG PO TABS: 5.0000 mg | ORAL_TABLET | Freq: Every evening | ORAL | Status: DC | PRN

## 2014-10-26 MED ORDER — OXYCODONE-ACETAMINOPHEN 5-325 MG PO TABS: 2.0000 | ORAL_TABLET | ORAL | Status: DC | PRN

## 2014-10-26 MED ORDER — MAGNESIUM HYDROXIDE 400 MG/5ML PO SUSP: 30.0000 mL | ORAL | Status: DC | PRN

## 2014-10-26 MED ORDER — SODIUM CHLORIDE 0.9 % IJ SOLN
3.0000 mL | Freq: Two times a day (BID) | INTRAMUSCULAR | Status: DC
  Administered 2014-10-26: 3 mL via INTRAVENOUS

## 2014-10-26 MED ORDER — PRENATAL MULTIVITAMIN CH
1.0000 | ORAL_TABLET | Freq: Every day | ORAL | Status: DC
  Administered 2014-10-26 – 2014-10-27 (×2): 1 via ORAL
  Filled 2014-10-26 (×2): qty 1

## 2014-10-26 NOTE — Lactation Note (Signed)
This note was copied from the chart of [place]. Lactation Consultation Note'  P1, Baby sleepy in mother's arms.  Recently breastfed for 12 min. Mother feels breastfeeding is going well but Mother would like a latch check. [place] left phone number and requested mother call when baby cues. Mother states she had breast augmentation approx 10 years ago.    Patient Name: Kelly [name] [id number] Date: 10/26/2014     Maternal Data    Feeding    LATCH Score/Interventions                      Lactation Tools Discussed/Used     Consult Status      Carlye Grippe 10/26/2014, 3:38 PM

## 2014-10-26 NOTE — Progress Notes (Signed)
Patient is eating, ambulating, voiding.  Pain control is good. Discomfort from 3rd degree repair improved with ice.  Filed Vitals:   10/26/14 0216 10/26/14 0310 10/26/14 0428 10/26/14 0815  BP: 97/57 101/57 99/63 102/65  Pulse: 91 92 95 80  Temp:  98.7 F (37.1 C) 98.5 F (36.9 C) 98.6 F (37 C)  TempSrc:  Axillary Oral Oral  Resp: 20 18 18 18   Height:      Weight:      SpO2:    97%    Fundus firm Perineum without swelling. No CT  Lab Results  Component Value Date   WBC 9.1 10/25/2014   HGB 12.3 10/25/2014   HCT 35.9* 10/25/2014   MCV 91.1 10/25/2014   PLT 210 10/25/2014    --/--/A POS (08/08 1513)  A/P Post partum day 0 Doing well MH will do circ 8/10  Routine care.    Allyn Kenner

## 2014-10-26 NOTE — Lactation Note (Signed)
This note was copied from the chart of [place]. Lactation Consultation Note  P1, Baby 45 hours old.  Mother had breast augmentation approx 10 years ago for cosmetic reasons. Had noticeable breast changes during pregnancy. Baby was circumcised today at approx 1300 so he has been difficult to arouse to feed. Demonstrated how to hand express.  Mother expressed good flow of colostrum on spoon then fed to baby. Mother's nipple are pink and tender and flat. With lots of STS and burping, baby woke to feed.  Helped mother position him in football hold. With compression baby latched after a few attempts.  Sucks and swallows observed. Mother's nipples slightly pinched after feeding.  Recommend she continue to try to get as deep on the areola as possible. Reviewed how to use hand pump to evert nipple and suggest she burp before offering right breast. Discussed cluster feeding and basics.   Mom encouraged to feed baby 8-12 times/24 hours and with feeding cues.  Mom made aware of O/P services, breastfeeding support groups, community resources, and our phone # for post-discharge questions.  Mother was given a nipple shield from earlier RN.  Suggest she only use it if she becomes too tender to breastfeed.  Miranda RN aware. Mother has breast shells and comfort gels and suggest she alternate use for soreness.  Patient Name: Kelly [name] [id number] Date: 10/26/2014 Reason for consult: Initial assessment   Maternal Data    Feeding Feeding Type: Breast Fed Length of feed: 15 min  LATCH Score/Interventions Latch: Repeated attempts needed to sustain latch, nipple held in mouth throughout feeding, stimulation needed to elicit sucking reflex. Intervention(s): Adjust position;Assist with latch;Breast massage;Breast compression  Audible Swallowing: Spontaneous and intermittent  Type of Nipple: Flat Intervention(s): Hand pump;Shells  Comfort (Breast/Nipple): Filling, red/small  blisters or bruises, mild/mod discomfort  Problem noted: Mild/Moderate discomfort Interventions (Mild/moderate discomfort): Hand expression;Comfort gels  Hold (Positioning): Assistance needed to correctly position infant at breast and maintain latch.  LATCH Score: 6  Lactation Tools Discussed/Used     Consult Status Consult Status: Follow-up Date: 10/27/14 Follow-up type: In-patient    Vivianne Master [facility] 10/26/2014, 8:52 PM

## 2014-10-26 NOTE — Progress Notes (Signed)
Error in charting- charted info on wrong pt

## 2014-10-26 NOTE — Anesthesia Postprocedure Evaluation (Signed)
Anesthesia Post Note  Patient: Kelly [name]  Procedure(s) Performed: * No procedures listed *  Anesthesia type: Epidural  Patient location: Mother/Baby  Post pain: Pain level controlled  Post assessment: Post-op Vital signs reviewed  Last Vitals:  Filed Vitals:   10/26/14 0428  BP: 99/63  Pulse: 95  Temp: 36.9 C  Resp: 18    Post vital signs: Reviewed  Level of consciousness: awake  Complications: No apparent anesthesia complications

## 2014-10-27 LAB — CBC
HCT: 26.6 % — ABNORMAL LOW (ref 36.0–46.0)
Hemoglobin: 8.9 g/dL — ABNORMAL LOW (ref 12.0–15.0)
MCH: 30.9 pg (ref 26.0–34.0)
MCHC: 33.5 g/dL (ref 30.0–36.0)
MCV: 92.4 fL (ref 78.0–100.0)
Platelets: 188 10*3/uL (ref 150–400)
RBC: 2.88 MIL/uL — ABNORMAL LOW (ref 3.87–5.11)
RDW: 13.3 % (ref 11.5–15.5)
WBC: 10.8 10*3/uL — ABNORMAL HIGH (ref 4.0–10.5)

## 2014-10-27 MED ORDER — IBUPROFEN 600 MG PO TABS: 600.0000 mg | ORAL_TABLET | Freq: Four times a day (QID) | ORAL | Status: DC | PRN

## 2014-10-27 MED ORDER — OXYCODONE-ACETAMINOPHEN 5-325 MG PO TABS: 2.0000 | ORAL_TABLET | ORAL | Status: DC | PRN

## 2014-10-27 MED ORDER — DOCUSATE SODIUM 100 MG PO CAPS: 100.0000 mg | ORAL_CAPSULE | Freq: Two times a day (BID) | ORAL | Status: DC

## 2014-10-27 NOTE — Discharge Summary (Signed)
Obstetric Discharge Summary Reason for Admission: onset of labor Prenatal Procedures: NST, ultrasound and BMZ x 2 and MGSO4 @ 32 weeks for PTL Intrapartum Procedures: spontaneous vaginal delivery Postpartum Procedures: none Complications-Operative and Postpartum: partial 3rd  degree perineal laceration HEMOGLOBIN  Date Value Ref Range Status  10/27/2014 8.9* 12.0 - 15.0 g/dL Final    Comment:    DELTA CHECK NOTED REPEATED TO VERIFY    HGB  Date Value Ref Range Status  03/15/2014 13.6 12.0-16.0 g/dL Final   HCT  Date Value Ref Range Status  10/27/2014 26.6* 36.0 - 46.0 % Final  03/15/2014 40.6 35.0-47.0 % Final    Physical Exam:  General: alert, cooperative and appears stated age 29: appropriate Uterine Fundus: firm  Discharge Diagnoses: Term Pregnancy-delivered  Discharge Information: Date: 10/27/2014 Activity: pelvic rest Diet: routine Medications: Ibuprofen, Colace and Percocet Condition: improved Instructions: refer to practice specific booklet Discharge to: home Follow-up Information    Follow up with HORVATH,MICHELLE A, MD In 4 weeks.   Specialty:  Obstetrics and Gynecology   Why:  4-6 weeks for a postpartum evaluation   Contact information:   [place] [address] [phone]       Newborn Data: Live born female  Birth Weight: 7 lb 10 oz (3459 g) APGAR: 8, 9  Home with mother.  Kelly [name] H. 10/27/2014, 8:06 AM

## 2014-10-27 NOTE — Lactation Note (Signed)
This note was copied from the chart of [place]. Lactation Consultation Note  Patient Name: Kelly [name] [id number] Date: 10/27/2014 Reason for consult: Follow-up assessment;NICU baby NICU baby 55 hours old. Called to NICU to assist with latching. Baby sleepy at breast. Assisted mom to latch baby in cross-cradle position to right breast. After much stimulation, baby able to suckle about 3 minutes. Demonstrated to parents how to evert baby's lower lip and mom reports increased comfort immediately. Baby did not suckle for long and no swallows noted. Enc mom to begin using DEBP when she returns to room. Discussed pumping 3 times before bed for 15 minutes, and then sleeping a 5-hour sleep as able, and pumping every 2-3 hours throughout the day. Discussed pumping at least 8 times/24 hours for 15 minutes. Discussed normal progression of milk coming to volume.  Placed DEBP, pumping kit, and supplies in patient's room. Enc parents to call out for nursing assistance with use of pump, and discussed need to assist with pumping with patient's bedside nurse. Enc parents to call out for assistance as needed.   Maternal Data Has patient been taught Hand Expression?: Yes Does the patient have breastfeeding experience prior to this delivery?: No  Feeding Feeding Type: Breast Fed Length of feed: 3 min  LATCH Score/Interventions Latch: Repeated attempts needed to sustain latch, nipple held in mouth throughout feeding, stimulation needed to elicit sucking reflex. Intervention(s): Adjust position;Assist with latch;Breast compression  Audible Swallowing: None  Type of Nipple: Flat (short shaft)  Comfort (Breast/Nipple): Filling, red/small blisters or bruises, mild/mod discomfort  Problem noted: Mild/Moderate discomfort Interventions (Mild/moderate discomfort): Hand expression;Post-pump  Hold (Positioning): Assistance needed to correctly position infant at breast and maintain  latch. Intervention(s): Breastfeeding basics reviewed;Support Pillows;Position options;Skin to skin  LATCH Score: 4  Lactation Tools Discussed/Used Pump Review: Setup, frequency, and cleaning Initiated by:: Bedside nurse. Date initiated:: 10/27/14   Consult Status Consult Status: Follow-up Date: 10/28/14 Follow-up type: In-patient    [name] 10/27/2014, 4:34 PM

## 2014-10-28 ENCOUNTER — Ambulatory Visit: Payer: Self-pay

## 2014-10-28 NOTE — Lactation Note (Signed)
This note was copied from the chart of [place]. Lactation Consultation Note  Follow up visit made prior to discharge.  Mom states breastfeeding is going better each day although she still feels some latch on pain.  She is working on untucking his lower lip.  Mom is wearing comfort gels.  Discharge instructions include feeding with any cues, using good waking techniques and breast massage, keeping a feeding diary, milk coming to volume and engorgement treatment.  Baby has an appointment with pediatrician tomorrow.  Outpatient lactation services and support encouraged prn.  Patient Name: Kelly [name] [id number] Date: 10/28/2014     Maternal Data    Feeding Feeding Type: Breast Fed Length of feed: 40 min  LATCH Score/Interventions Latch: Grasps breast easily, tongue down, lips flanged, rhythmical sucking.  Audible Swallowing: Spontaneous and intermittent  Type of Nipple: Flat  Comfort (Breast/Nipple): Filling, red/small blisters or bruises, mild/mod discomfort     Hold (Positioning): No assistance needed to correctly position infant at breast.  LATCH Score: 8  Lactation Tools Discussed/Used     Consult Status      Ave Filter 10/28/2014, 11:18 AM

## 2014-11-02 ENCOUNTER — Inpatient Hospital Stay (HOSPITAL_COMMUNITY): Payer: Managed Care, Other (non HMO)

## 2014-11-03 ENCOUNTER — Encounter (HOSPITAL_COMMUNITY): Payer: Self-pay | Admitting: *Deleted

## 2014-12-02 ENCOUNTER — Other Ambulatory Visit: Payer: Self-pay | Admitting: Obstetrics and Gynecology

## 2014-12-02 DIAGNOSIS — N6320 Unspecified lump in the left breast, unspecified quadrant: Secondary | ICD-10-CM

## 2014-12-06 ENCOUNTER — Ambulatory Visit
Admission: RE | Admit: 2014-12-06 | Discharge: 2014-12-06 | Disposition: A | Discharge status: Home / Self Care | Payer: Managed Care, Other (non HMO) | Source: Ambulatory Visit | Attending: Obstetrics and Gynecology | Admitting: Obstetrics and Gynecology

## 2014-12-06 DIAGNOSIS — N6320 Unspecified lump in the left breast, unspecified quadrant: Secondary | ICD-10-CM

## 2015-09-23 LAB — OB RESULTS CONSOLE RPR: RPR: NONREACTIVE

## 2015-09-23 LAB — OB RESULTS CONSOLE HIV ANTIBODY (ROUTINE TESTING): HIV: NONREACTIVE

## 2015-09-23 LAB — OB RESULTS CONSOLE RUBELLA ANTIBODY, IGM: Rubella: IMMUNE

## 2015-09-23 LAB — OB RESULTS CONSOLE HEPATITIS B SURFACE ANTIGEN: Hepatitis B Surface Ag: NEGATIVE

## 2015-11-15 ENCOUNTER — Other Ambulatory Visit (HOSPITAL_COMMUNITY): Payer: Self-pay | Admitting: Obstetrics and Gynecology

## 2015-11-15 DIAGNOSIS — Z3A18 18 weeks gestation of pregnancy: Secondary | ICD-10-CM

## 2015-11-15 DIAGNOSIS — R9389 Abnormal findings on diagnostic imaging of other specified body structures: Secondary | ICD-10-CM

## 2015-11-15 DIAGNOSIS — Z3689 Encounter for other specified antenatal screening: Secondary | ICD-10-CM

## 2015-11-17 ENCOUNTER — Encounter (HOSPITAL_COMMUNITY): Payer: Self-pay | Admitting: *Deleted

## 2015-11-18 ENCOUNTER — Encounter (HOSPITAL_COMMUNITY): Payer: Self-pay

## 2015-11-18 ENCOUNTER — Ambulatory Visit (HOSPITAL_COMMUNITY): Admission: RE | Admit: 2015-11-18 | Payer: Managed Care, Other (non HMO) | Source: Ambulatory Visit

## 2015-11-18 ENCOUNTER — Ambulatory Visit (HOSPITAL_COMMUNITY)
Admission: RE | Admit: 2015-11-18 | Discharge: 2015-11-18 | Disposition: A | Discharge status: Home / Self Care | Payer: Managed Care, Other (non HMO) | Source: Ambulatory Visit | Attending: Obstetrics and Gynecology | Admitting: Obstetrics and Gynecology

## 2015-11-18 ENCOUNTER — Other Ambulatory Visit (HOSPITAL_COMMUNITY): Payer: Self-pay | Admitting: Obstetrics and Gynecology

## 2015-11-18 DIAGNOSIS — Z3A18 18 weeks gestation of pregnancy: Secondary | ICD-10-CM | POA: Diagnosis not present

## 2015-11-18 DIAGNOSIS — Z36 Encounter for antenatal screening of mother: Secondary | ICD-10-CM | POA: Insufficient documentation

## 2015-11-18 DIAGNOSIS — R9389 Abnormal findings on diagnostic imaging of other specified body structures: Secondary | ICD-10-CM

## 2015-11-18 DIAGNOSIS — Z3689 Encounter for other specified antenatal screening: Secondary | ICD-10-CM

## 2015-11-23 ENCOUNTER — Other Ambulatory Visit (HOSPITAL_COMMUNITY): Payer: Self-pay

## 2015-11-23 ENCOUNTER — Encounter (HOSPITAL_COMMUNITY): Payer: Self-pay

## 2016-02-01 ENCOUNTER — Observation Stay (HOSPITAL_COMMUNITY)
Admission: AD | Admit: 2016-02-01 | Discharge: 2016-02-03 | Disposition: A | Discharge status: Home / Self Care | Payer: Managed Care, Other (non HMO) | Source: Ambulatory Visit | Attending: Obstetrics and Gynecology | Admitting: Obstetrics and Gynecology

## 2016-02-01 ENCOUNTER — Encounter (HOSPITAL_COMMUNITY): Payer: Self-pay | Admitting: *Deleted

## 2016-02-01 DIAGNOSIS — O4703 False labor before 37 completed weeks of gestation, third trimester: Secondary | ICD-10-CM | POA: Diagnosis not present

## 2016-02-01 DIAGNOSIS — Z3A25 25 weeks gestation of pregnancy: Secondary | ICD-10-CM | POA: Insufficient documentation

## 2016-02-01 DIAGNOSIS — Z79899 Other long term (current) drug therapy: Secondary | ICD-10-CM | POA: Insufficient documentation

## 2016-02-01 LAB — CBC WITH DIFFERENTIAL/PLATELET
BASOS ABS: 0 10*3/uL (ref 0.0–0.1)
BASOS PCT: 0 %
EOS PCT: 0 %
Eosinophils Absolute: 0 10*3/uL (ref 0.0–0.7)
HCT: 31.3 % — ABNORMAL LOW (ref 36.0–46.0)
Hemoglobin: 11 g/dL — ABNORMAL LOW (ref 12.0–15.0)
LYMPHS PCT: 20 %
Lymphs Abs: 2.1 10*3/uL (ref 0.7–4.0)
MCH: 31.5 pg (ref 26.0–34.0)
MCHC: 35.1 g/dL (ref 30.0–36.0)
MCV: 89.7 fL (ref 78.0–100.0)
Monocytes Absolute: 0.5 10*3/uL (ref 0.1–1.0)
Monocytes Relative: 4 %
Neutro Abs: 7.9 10*3/uL — ABNORMAL HIGH (ref 1.7–7.7)
Neutrophils Relative %: 76 %
PLATELETS: 265 10*3/uL (ref 150–400)
RBC: 3.49 MIL/uL — AB (ref 3.87–5.11)
RDW: 12.6 % (ref 11.5–15.5)
WBC: 10.5 10*3/uL (ref 4.0–10.5)

## 2016-02-01 LAB — URINALYSIS, ROUTINE W REFLEX MICROSCOPIC
Bilirubin Urine: NEGATIVE
GLUCOSE, UA: NEGATIVE mg/dL
Hgb urine dipstick: NEGATIVE
Ketones, ur: NEGATIVE mg/dL
LEUKOCYTES UA: NEGATIVE
Nitrite: NEGATIVE
PH: 7 (ref 5.0–8.0)
PROTEIN: NEGATIVE mg/dL
Specific Gravity, Urine: <1.005 — ABNORMAL LOW (ref 1.005–1.030)

## 2016-02-01 LAB — TYPE AND SCREEN
ABO/RH(D): A POS
Antibody Screen: NEGATIVE

## 2016-02-01 MED ORDER — PRENATAL MULTIVITAMIN CH
1.0000 | ORAL_TABLET | Freq: Every day | ORAL | Status: DC
  Administered 2016-02-01 – 2016-02-02 (×2): 1 via ORAL
  Filled 2016-02-01 (×2): qty 1

## 2016-02-01 MED ORDER — ZOLPIDEM TARTRATE 5 MG PO TABS
5.0000 mg | ORAL_TABLET | Freq: Every evening | ORAL | Status: DC | PRN
  Administered 2016-02-02: 5 mg via ORAL
  Filled 2016-02-01: qty 1

## 2016-02-01 MED ORDER — NIFEDIPINE 10 MG PO CAPS: 10.0000 mg | ORAL_CAPSULE | Freq: Four times a day (QID) | ORAL | Status: DC

## 2016-02-01 MED ORDER — NIFEDIPINE 10 MG PO CAPS: 20.0000 mg | ORAL_CAPSULE | Freq: Once | ORAL | Status: DC

## 2016-02-01 MED ORDER — NIFEDIPINE 10 MG PO CAPS
10.0000 mg | ORAL_CAPSULE | ORAL | Status: AC
  Administered 2016-02-01 (×3): 10 mg via ORAL
  Filled 2016-02-01 (×3): qty 1

## 2016-02-01 MED ORDER — LACTATED RINGERS IV SOLN
INTRAVENOUS | Status: DC
  Administered 2016-02-01 – 2016-02-02 (×4): via INTRAVENOUS

## 2016-02-01 MED ORDER — FOLIC ACID 800 MCG PO TABS: 800.0000 ug | ORAL_TABLET | Freq: Every day | ORAL | Status: DC

## 2016-02-01 MED ORDER — MAGNESIUM SULFATE 50 % IJ SOLN
2.0000 g/h | INTRAVENOUS | Status: DC
  Administered 2016-02-02: 2 g/h via INTRAVENOUS
  Filled 2016-02-01 (×2): qty 80

## 2016-02-01 MED ORDER — DOCUSATE SODIUM 100 MG PO CAPS: 100.0000 mg | ORAL_CAPSULE | Freq: Every day | ORAL | Status: DC

## 2016-02-01 MED ORDER — MAGNESIUM SULFATE BOLUS VIA INFUSION
4.0000 g | Freq: Once | INTRAVENOUS | Status: AC
  Administered 2016-02-01: 4 g via INTRAVENOUS
  Filled 2016-02-01: qty 500

## 2016-02-01 MED ORDER — PRENATAL MULTIVITAMIN CH: 1.0000 | ORAL_TABLET | Freq: Every day | ORAL | Status: DC

## 2016-02-01 MED ORDER — DOCUSATE SODIUM 100 MG PO CAPS
100.0000 mg | ORAL_CAPSULE | Freq: Two times a day (BID) | ORAL | Status: DC
  Administered 2016-02-01 – 2016-02-02 (×3): 100 mg via ORAL
  Filled 2016-02-01 (×4): qty 1

## 2016-02-01 MED ORDER — ACETAMINOPHEN 325 MG PO TABS
650.0000 mg | ORAL_TABLET | ORAL | Status: DC | PRN
  Administered 2016-02-02: 650 mg via ORAL
  Filled 2016-02-01: qty 2

## 2016-02-01 MED ORDER — FLUOXETINE HCL 20 MG PO CAPS
20.0000 mg | ORAL_CAPSULE | Freq: Every day | ORAL | Status: DC
  Administered 2016-02-01 – 2016-02-02 (×2): 20 mg via ORAL
  Filled 2016-02-01 (×2): qty 1

## 2016-02-01 MED ORDER — CALCIUM CARBONATE ANTACID 500 MG PO CHEW: 2.0000 | CHEWABLE_TABLET | ORAL | Status: DC | PRN

## 2016-02-01 MED ORDER — FOLIC ACID 1 MG PO TABS
1.0000 mg | ORAL_TABLET | Freq: Every day | ORAL | Status: DC
  Administered 2016-02-01 – 2016-02-02 (×2): 1 mg via ORAL
  Filled 2016-02-01 (×2): qty 1

## 2016-02-01 MED ORDER — BETAMETHASONE SOD PHOS & ACET 6 (3-3) MG/ML IJ SUSP
12.0000 mg | INTRAMUSCULAR | Status: AC
  Administered 2016-02-01 – 2016-02-02 (×2): 12 mg via INTRAMUSCULAR
  Filled 2016-02-01 (×2): qty 2

## 2016-02-01 NOTE — H&P (Signed)
30 y.o. [redacted]w[redacted]d  G3P1011 comes in c/o preterm contractions and cervical change.  Otherwise has good fetal movement and no bleeding.  Pt had some watery d/c on pad which was checked in office and negative for fern, valsalva, pool.  Ctxes were persisting every 3 minutes on monitor at office.  US showed cervical length now 1.9 cm- previously was 3.2 cm.  Pt receiving makena for early onset of contractions in this second pregnancy and hx of preterm labor in last pregnancy.  Pt did deliver at term then.    No past medical history on file.  Past Surgical History:  Procedure Laterality Date  . BREAST SURGERY      OB History  Gravida Para Term Preterm AB Living  3 1 1   1 1   SAB TAB Ectopic Multiple Live Births      1 0 1    # Outcome Date GA Lbr Len/2nd Weight Sex Delivery Anes PTL Lv  3 Current           2 Term 10/26/14 [redacted]w[redacted]d 12:00 / 00:40 3.459 kg (7 lb 10 oz) M Vag-Spont EPI  LIV  1 Ectopic               Social History   Social History  . Marital status: Married    Spouse name: N/[name]  . Number of children: N/[name]  . Years of education: N/[name]   Occupational History  . Not on file.   Social History Main Topics  . Smoking status: Never Smoker  . Smokeless tobacco: Never Used  . Alcohol use No  . Drug use: No  . Sexual activity: Not on file   Other Topics Concern  . Not on file   Social History Narrative  . No narrative on file   Patient has no known allergies.    Prenatal Transfer Tool  Maternal Diabetes: No Genetic Screening: Normal Maternal Ultrasounds/Referrals: Normal Fetal Ultrasounds or other Referrals:  Referred to Materal Fetal Medicine - borderline AFP, normal [place] Maternal Substance Abuse:  No Significant Maternal Medications:  None Significant Maternal Lab Results: None  Other PNC: uncomplicated.    There were no vitals filed for this visit.   Lungs/Cor:  NAD Abdomen:  soft, gravid Ex:  no cords, erythema SVE:  1/long/-3, VTX FHTs:  140s, good STV, NST  R Toco:  q 3 minutes   [name]/P   30 y.o. G3P1011 [redacted]w[redacted]d with preterm contractions and cervical change from previous exam.  Hx of preterm labor.  Here for 24 hour obs for now- will do BMZ, procardia and recheck cervix.  If changes again, will start Magnesium sulfate for neuroprotection and stop labor.   GBS unknown- will check.    Kelly [name]

## 2016-02-01 NOTE — Progress Notes (Addendum)
Pt reports she's still having ctxs but not as frequently.  Pt informed RN will call Dr. Philis Pique and discuss pt status. Pt reports she's spoken to Dr. Philis Pique via text messaging and MD will be into see her after office hours.

## 2016-02-02 DIAGNOSIS — O4703 False labor before 37 completed weeks of gestation, third trimester: Secondary | ICD-10-CM | POA: Diagnosis not present

## 2016-02-02 LAB — CULTURE, BETA STREP (GROUP B ONLY)

## 2016-02-02 LAB — RPR: RPR Ser Ql: NONREACTIVE

## 2016-02-02 NOTE — Progress Notes (Signed)
Magnesium sulfate d/d'd per Dr. Philis Pique. IV also saline locked. Toya Smothers, RN

## 2016-02-02 NOTE — Progress Notes (Signed)
30 y.o. [id number] [redacted]w[redacted]d HD#1 admitted for Preterm labor.  Pt currently stable with no c/o except contractions now q 20 min.  Good FM.  Vitals:   02/02/16 0406 02/02/16 0500 02/02/16 0600 02/02/16 0725  BP: (!) 94/49   (!) 99/58  Pulse: 83   89  Resp: 18 16 17 16   Temp:    98.4 F (36.9 C)  TempSrc:    Oral  SpO2:      Weight:      Height:        Lungs CTA Cor RRR Abd  Soft, gravid, nontender Ex SCDs FHTs  130s, good short term variability, NST R Toco  Now q 20 minutes  Results for orders placed or performed during the hospital encounter of 02/01/16 (from the past 24 hour(s))  Type and screen     Status: None   Collection Time: 02/01/16  2:33 PM  Result Value Ref Range   ABO/RH(D) A POS    Antibody Screen NEG    Sample Expiration 02/04/2016   CBC with Differential/Platelet     Status: Abnormal   Collection Time: 02/01/16  2:33 PM  Result Value Ref Range   WBC 10.5 4.0 - 10.5 K/uL   RBC 3.49 (L) 3.87 - 5.11 MIL/uL   Hemoglobin 11.0 (L) 12.0 - 15.0 g/dL   HCT 31.3 (L) 36.0 - 46.0 %   MCV 89.7 78.0 - 100.0 fL   MCH 31.5 26.0 - 34.0 pg   MCHC 35.1 30.0 - 36.0 g/dL   RDW 12.6 11.5 - 15.5 %   Platelets 265 150 - 400 K/uL   Neutrophils Relative % 76 %   Neutro Abs 7.9 (H) 1.7 - 7.7 K/uL   Lymphocytes Relative 20 %   Lymphs Abs 2.1 0.7 - 4.0 K/uL   Monocytes Relative 4 %   Monocytes Absolute 0.5 0.1 - 1.0 K/uL   Eosinophils Relative 0 %   Eosinophils Absolute 0.0 0.0 - 0.7 K/uL   Basophils Relative 0 %   Basophils Absolute 0.0 0.0 - 0.1 K/uL  RPR     Status: None   Collection Time: 02/01/16  2:33 PM  Result Value Ref Range   RPR Ser Ql Non Reactive Non Reactive  Urinalysis, Routine w reflex microscopic (not at [facility])     Status: Abnormal   Collection Time: 02/01/16  2:35 PM  Result Value Ref Range   Color, Urine YELLOW YELLOW   APPearance CLEAR CLEAR   Specific Gravity, Urine <1.005 (L) 1.005 - 1.030   pH 7.0 5.0 - 8.0   Glucose, UA NEGATIVE NEGATIVE mg/dL   Hgb  urine dipstick NEGATIVE NEGATIVE   Bilirubin Urine NEGATIVE NEGATIVE   Ketones, ur NEGATIVE NEGATIVE mg/dL   Protein, ur NEGATIVE NEGATIVE mg/dL   Nitrite NEGATIVE NEGATIVE   Leukocytes, UA NEGATIVE NEGATIVE    A:  HD#1  [redacted]w[redacted]d with Preterm labor.  Stable on magnesium sulfate for neuroprotection and tocolysis.  P: Second dose of BMZ due today.  Will stop magnesium at 24 hours.  Not on antibiotics since delivery is not imminent and GBS is pending.  [place] was done at office- baby was above 50%ile with good fluid and vtx.  Meshawn Oconnor A

## 2016-02-02 NOTE — Progress Notes (Signed)
CRITICAL VALUE ALERT  Critical value received:  Group Beta Strep Date of notification: 02/02/16 Time of notification:  [id number] Critical value read back: yes  Nurse who received alert:  Fransisca Connors, RN  MD notified (1st page):  02/02/16  Time of first page:  1255  MD notified (2nd page):  Time of second page:  Responding MD:  Philis Pique  Time MD responded: [id number]  Culture  (A)  GROUP B STREP(S.AGALACTIAE)ISOLATED  CRITICAL RESULT CALLED TO, READ BACK BY AND VERIFIED WITH: Earney Navy RN, AT 1256 02/02/16 BY Rush Landmark  Performed at [facility]

## 2016-02-03 DIAGNOSIS — O4703 False labor before 37 completed weeks of gestation, third trimester: Secondary | ICD-10-CM | POA: Diagnosis not present

## 2016-02-03 MED ORDER — NIFEDIPINE 10 MG PO CAPS: 10.0000 mg | ORAL_CAPSULE | Freq: Four times a day (QID) | ORAL | 1 refills | Status: DC

## 2016-02-03 MED ORDER — NIFEDIPINE 10 MG PO CAPS
10.0000 mg | ORAL_CAPSULE | Freq: Four times a day (QID) | ORAL | Status: DC
Start: 2016-02-03 — End: 2016-02-03
  Administered 2016-02-03: 10 mg via ORAL
  Filled 2016-02-03: qty 1

## 2016-02-03 NOTE — Progress Notes (Addendum)
30 y.o. G3P1011 [redacted]w[redacted]d HD#1 admitted for preterm labor  Pt currently stable; contractions this am were q 5-10 for a short time but decreased with eating.Kermit Balo FM.  Vitals:   02/02/16 1400 02/02/16 1800 02/02/16 2150 02/03/16 0745  BP: 104/61 108/61 113/65 (!) 101/59  Pulse: 96 86 74 73  Resp:  18  18  Temp:  98.8 F (37.1 C)  98.8 F (37.1 C)  TempSrc:  Oral    SpO2:  100% 100%   Weight:      Height:        Lungs CTA Cor RRR Abd  Soft, gravid, nontender Ex SCDs FHTs  Last night 130s, good short term variability, NST R Toco   occ irritability, 5 ctxes this am now spaced out again.    GBS +  A/p:  HD#1  [redacted]w[redacted]d with preterm labor.   Checked cervix last pm - still 2/50/-2 and magnesium was turned off.  Pt stable overnight.   GBS positive vaginal culture.  Pt stable after 24 hours of magnesium sulftate and betamethasone.  Will d/c to home with procardia to take for contractions.  F/u weekly, bedrest.  Pt due for makena today- will get at office.    Antonela Freiman A

## 2016-02-03 NOTE — Discharge Summary (Signed)
Physician Discharge Summary  Patient ID: Kelly [name] MRN: [id number] DOB/AGE: Mar 19, 1986 30 y.o.  Admit date: 02/01/2016 Discharge date: 02/03/2016  Admission Diagnoses:preterm labor  Discharge Diagnoses:  Active Problems:   Preterm labor in third trimester without delivery   Preterm uterine contractions in third trimester, antepartum   Discharged Condition: good  Hospital Course: Pt changed cervix and was admitted for magnesium sulfate and BMZ.  UA was negative, GBS vaginal was positive.  Contractions slowed and pt did not change her cervix further (last check was 2/50/-)  Consults: None  Significant Diagnostic Studies:  Results for orders placed or performed during the hospital encounter of 02/01/16 (from the past 48 hour(s))  Type and screen     Status: None   Collection Time: 02/01/16  2:33 PM  Result Value Ref Range   ABO/RH(D) A POS    Antibody Screen NEG    Sample Expiration 02/04/2016   CBC with Differential/Platelet     Status: Abnormal   Collection Time: 02/01/16  2:33 PM  Result Value Ref Range   WBC 10.5 4.0 - 10.5 K/uL   RBC 3.49 (L) 3.87 - 5.11 MIL/uL   Hemoglobin 11.0 (L) 12.0 - 15.0 g/dL   HCT 31.3 (L) 36.0 - 46.0 %   MCV 89.7 78.0 - 100.0 fL   MCH 31.5 26.0 - 34.0 pg   MCHC 35.1 30.0 - 36.0 g/dL   RDW 12.6 11.5 - 15.5 %   Platelets 265 150 - 400 K/uL   Neutrophils Relative % 76 %   Neutro Abs 7.9 (H) 1.7 - 7.7 K/uL   Lymphocytes Relative 20 %   Lymphs Abs 2.1 0.7 - 4.0 K/uL   Monocytes Relative 4 %   Monocytes Absolute 0.5 0.1 - 1.0 K/uL   Eosinophils Relative 0 %   Eosinophils Absolute 0.0 0.0 - 0.7 K/uL   Basophils Relative 0 %   Basophils Absolute 0.0 0.0 - 0.1 K/uL  RPR     Status: None   Collection Time: 02/01/16  2:33 PM  Result Value Ref Range   RPR Ser Ql Non Reactive Non Reactive    Comment: (NOTE) Performed At: [facility] [place], [place] [id number] Lindon Romp MD [id number]   Urinalysis,  Routine w reflex microscopic (not at [facility])     Status: Abnormal   Collection Time: 02/01/16  2:35 PM  Result Value Ref Range   Color, Urine YELLOW YELLOW   APPearance CLEAR CLEAR   Specific Gravity, Urine <1.005 (L) 1.005 - 1.030   pH 7.0 5.0 - 8.0   Glucose, UA NEGATIVE NEGATIVE mg/dL   Hgb urine dipstick NEGATIVE NEGATIVE   Bilirubin Urine NEGATIVE NEGATIVE   Ketones, ur NEGATIVE NEGATIVE mg/dL   Protein, ur NEGATIVE NEGATIVE mg/dL   Nitrite NEGATIVE NEGATIVE   Leukocytes, UA NEGATIVE NEGATIVE    Comment: MICROSCOPIC NOT DONE ON URINES WITH NEGATIVE PROTEIN, BLOOD, LEUKOCYTES, NITRITE, OR GLUCOSE <1000 mg/dL.  Culture, beta strep (group b only)     Status: Abnormal   Collection Time: 02/01/16  2:45 PM  Result Value Ref Range   Specimen Description VAGINAL/RECTAL    Special Requests NONE    Culture (A)     GROUP B STREP(S.AGALACTIAE)ISOLATED CRITICAL RESULT CALLED TO, READ BACK BY AND VERIFIED WITH: Earney Navy RN, AT 1256 02/02/16 BY Rush Landmark Performed at [facility]    Report Status 02/02/2016 FINAL     Treatments: magnesium sulfate and bmz  Discharge Exam: Blood pressure Marland Kitchen)  101/59, pulse 73, temperature 98.8 F (37.1 C), resp. rate 18, height 5\' 4"  (1.626 m), weight 68 kg (150 lb), last menstrual period 07/15/2015, SpO2 100 %, unknown if currently breastfeeding.  Disposition: 01-Home or Self Care     Medication List    TAKE these medications   FLUoxetine 20 MG capsule Commonly known as:  PROZAC Take 20 mg by mouth at bedtime.   folic acid [id number] MCG tablet Commonly known as:  FOLVITE Take 800 mcg by mouth at bedtime.   NIFEdipine 10 MG capsule Commonly known as:  PROCARDIA Take 1 capsule (10 mg total) by mouth every 6 (six) hours.   prenatal multivitamin Tabs tablet Take 1 tablet by mouth at bedtime.      Follow-up Information    Tylicia Sherman A, MD Follow up in 1 week(s).   Specialty:  Obstetrics and Gynecology Contact information: [address] [place] [address] [phone]           Signed: Nasim Habeeb A 02/03/2016, 8:57 AM

## 2016-03-19 NOTE — L&D Delivery Note (Signed)
Patient was C/C/+5 and pushed for <1 minutes with epidural.    NSVD  female infant, Apgars 7,9, weight P.   The patient had a tiny perineal laceration (<1st degree) repaired with 3-0 vicryl R. Fundus was firm. EBL was expected amount. Placenta was delivered intact. Vagina was clear.  Baby was vigorous and doing skin to skin with mother.  Advik Weatherspoon A

## 2016-03-25 ENCOUNTER — Inpatient Hospital Stay (HOSPITAL_COMMUNITY)
Admission: AD | Admit: 2016-03-25 | Discharge: 2016-03-28 | DRG: 775 | Disposition: A | Discharge status: Home / Self Care | Payer: Managed Care, Other (non HMO) | Source: Ambulatory Visit | Attending: Obstetrics and Gynecology | Admitting: Obstetrics and Gynecology

## 2016-03-25 DIAGNOSIS — O99824 Streptococcus B carrier state complicating childbirth: Secondary | ICD-10-CM | POA: Diagnosis present

## 2016-03-25 DIAGNOSIS — Z3A36 36 weeks gestation of pregnancy: Secondary | ICD-10-CM

## 2016-03-26 ENCOUNTER — Encounter (HOSPITAL_COMMUNITY): Payer: Self-pay

## 2016-03-26 ENCOUNTER — Inpatient Hospital Stay (HOSPITAL_COMMUNITY): Payer: Managed Care, Other (non HMO) | Admitting: Anesthesiology

## 2016-03-26 DIAGNOSIS — Z3A36 36 weeks gestation of pregnancy: Secondary | ICD-10-CM | POA: Diagnosis not present

## 2016-03-26 DIAGNOSIS — O99824 Streptococcus B carrier state complicating childbirth: Secondary | ICD-10-CM | POA: Diagnosis present

## 2016-03-26 LAB — CBC
HEMATOCRIT: 34.1 % — AB (ref 36.0–46.0)
HEMOGLOBIN: 11.8 g/dL — AB (ref 12.0–15.0)
MCH: 31.2 pg (ref 26.0–34.0)
MCHC: 34.6 g/dL (ref 30.0–36.0)
MCV: 90.2 fL (ref 78.0–100.0)
Platelets: 224 10*3/uL (ref 150–400)
RBC: 3.78 MIL/uL — AB (ref 3.87–5.11)
RDW: 13.2 % (ref 11.5–15.5)
WBC: 9.8 10*3/uL (ref 4.0–10.5)

## 2016-03-26 LAB — TYPE AND SCREEN
ABO/RH(D): A POS
ANTIBODY SCREEN: NEGATIVE

## 2016-03-26 LAB — OB RESULTS CONSOLE GBS: GBS: POSITIVE

## 2016-03-26 LAB — RPR: RPR: NONREACTIVE

## 2016-03-26 MED ORDER — ZOLPIDEM TARTRATE 5 MG PO TABS: 5.0000 mg | ORAL_TABLET | Freq: Every evening | ORAL | Status: DC | PRN

## 2016-03-26 MED ORDER — SODIUM CHLORIDE 0.9% FLUSH: 3.0000 mL | Freq: Two times a day (BID) | INTRAVENOUS | Status: DC

## 2016-03-26 MED ORDER — FENTANYL 2.5 MCG/ML BUPIVACAINE 1/10 % EPIDURAL INFUSION (WH - ANES)
INTRAMUSCULAR | Status: AC
  Filled 2016-03-26: qty 100

## 2016-03-26 MED ORDER — BENZOCAINE-MENTHOL 20-0.5 % EX AERO
1.0000 "application " | INHALATION_SPRAY | CUTANEOUS | Status: DC | PRN
  Administered 2016-03-26: 1 via TOPICAL
  Filled 2016-03-26: qty 56

## 2016-03-26 MED ORDER — DIBUCAINE 1 % RE OINT: 1.0000 "application " | TOPICAL_OINTMENT | RECTAL | Status: DC | PRN

## 2016-03-26 MED ORDER — FERROUS SULFATE 325 (65 FE) MG PO TABS
325.0000 mg | ORAL_TABLET | Freq: Two times a day (BID) | ORAL | Status: DC
  Administered 2016-03-26 – 2016-03-28 (×4): 325 mg via ORAL
  Filled 2016-03-26 (×4): qty 1

## 2016-03-26 MED ORDER — DIPHENHYDRAMINE HCL 25 MG PO CAPS: 25.0000 mg | ORAL_CAPSULE | Freq: Four times a day (QID) | ORAL | Status: DC | PRN

## 2016-03-26 MED ORDER — EPHEDRINE 5 MG/ML INJ
10.0000 mg | INTRAVENOUS | Status: DC | PRN
  Filled 2016-03-26: qty 4

## 2016-03-26 MED ORDER — SIMETHICONE 80 MG PO CHEW: 80.0000 mg | CHEWABLE_TABLET | ORAL | Status: DC | PRN

## 2016-03-26 MED ORDER — LIDOCAINE HCL (PF) 1 % IJ SOLN
30.0000 mL | INTRAMUSCULAR | Status: DC | PRN
  Filled 2016-03-26: qty 30

## 2016-03-26 MED ORDER — MAGNESIUM HYDROXIDE 400 MG/5ML PO SUSP: 30.0000 mL | ORAL | Status: DC | PRN

## 2016-03-26 MED ORDER — METHYLERGONOVINE MALEATE 0.2 MG/ML IJ SOLN
INTRAMUSCULAR | Status: AC
  Filled 2016-03-26: qty 1

## 2016-03-26 MED ORDER — OXYTOCIN 40 UNITS IN LACTATED RINGERS INFUSION - SIMPLE MED
1.0000 m[IU]/min | INTRAVENOUS | Status: DC
  Administered 2016-03-26: 2 m[IU]/min via INTRAVENOUS
  Filled 2016-03-26: qty 1000

## 2016-03-26 MED ORDER — SODIUM CHLORIDE 0.9% FLUSH: 3.0000 mL | INTRAVENOUS | Status: DC | PRN

## 2016-03-26 MED ORDER — COCONUT OIL OIL: 1.0000 "application " | TOPICAL_OIL | Status: DC | PRN

## 2016-03-26 MED ORDER — PENICILLIN G POTASSIUM 5000000 UNITS IJ SOLR
5.0000 10*6.[IU] | Freq: Once | INTRAVENOUS | Status: AC
  Administered 2016-03-26: 5 10*6.[IU] via INTRAVENOUS
  Filled 2016-03-26: qty 5

## 2016-03-26 MED ORDER — OXYCODONE-ACETAMINOPHEN 5-325 MG PO TABS: 1.0000 | ORAL_TABLET | ORAL | Status: DC | PRN

## 2016-03-26 MED ORDER — FENTANYL 2.5 MCG/ML BUPIVACAINE 1/10 % EPIDURAL INFUSION (WH - ANES)
14.0000 mL/h | INTRAMUSCULAR | Status: DC
  Administered 2016-03-26 (×2): 14 mL/h via EPIDURAL
  Filled 2016-03-26: qty 100

## 2016-03-26 MED ORDER — ACETAMINOPHEN 325 MG PO TABS: 650.0000 mg | ORAL_TABLET | ORAL | Status: DC | PRN

## 2016-03-26 MED ORDER — PRENATAL MULTIVITAMIN CH
1.0000 | ORAL_TABLET | Freq: Every day | ORAL | Status: DC
  Administered 2016-03-26 – 2016-03-27 (×2): 1 via ORAL
  Filled 2016-03-26 (×2): qty 1

## 2016-03-26 MED ORDER — TETANUS-DIPHTH-ACELL PERTUSSIS 5-2.5-18.5 LF-MCG/0.5 IM SUSP: 0.5000 mL | Freq: Once | INTRAMUSCULAR | Status: DC

## 2016-03-26 MED ORDER — ONDANSETRON HCL 4 MG/2ML IJ SOLN: 4.0000 mg | INTRAMUSCULAR | Status: DC | PRN

## 2016-03-26 MED ORDER — METHYLERGONOVINE MALEATE 0.2 MG/ML IJ SOLN
0.2000 mg | INTRAMUSCULAR | Status: DC | PRN
  Administered 2016-03-26: 0.2 mg via INTRAMUSCULAR

## 2016-03-26 MED ORDER — FLUOXETINE HCL 20 MG PO CAPS
20.0000 mg | ORAL_CAPSULE | Freq: Every day | ORAL | Status: DC
  Filled 2016-03-26 (×2): qty 1

## 2016-03-26 MED ORDER — IBUPROFEN 800 MG PO TABS
800.0000 mg | ORAL_TABLET | Freq: Three times a day (TID) | ORAL | Status: DC
  Administered 2016-03-26 – 2016-03-28 (×6): 800 mg via ORAL
  Filled 2016-03-26 (×6): qty 1

## 2016-03-26 MED ORDER — PENICILLIN G POT IN DEXTROSE 60000 UNIT/ML IV SOLN
3.0000 10*6.[IU] | INTRAVENOUS | Status: DC
  Administered 2016-03-26 (×2): 3 10*6.[IU] via INTRAVENOUS
  Filled 2016-03-26 (×6): qty 50

## 2016-03-26 MED ORDER — PHENYLEPHRINE 40 MCG/ML (10ML) SYRINGE FOR IV PUSH (FOR BLOOD PRESSURE SUPPORT)
80.0000 ug | PREFILLED_SYRINGE | INTRAVENOUS | Status: DC | PRN
  Filled 2016-03-26: qty 5

## 2016-03-26 MED ORDER — METHYLERGONOVINE MALEATE 0.2 MG PO TABS: 0.2000 mg | ORAL_TABLET | ORAL | Status: DC | PRN

## 2016-03-26 MED ORDER — LACTATED RINGERS IV SOLN: 500.0000 mL | Freq: Once | INTRAVENOUS | Status: DC

## 2016-03-26 MED ORDER — OXYTOCIN BOLUS FROM INFUSION: 500.0000 mL | Freq: Once | INTRAVENOUS | Status: DC

## 2016-03-26 MED ORDER — ONDANSETRON HCL 4 MG/2ML IJ SOLN
4.0000 mg | Freq: Four times a day (QID) | INTRAMUSCULAR | Status: DC | PRN
  Administered 2016-03-26: 4 mg via INTRAVENOUS
  Filled 2016-03-26: qty 2

## 2016-03-26 MED ORDER — SENNOSIDES-DOCUSATE SODIUM 8.6-50 MG PO TABS
2.0000 | ORAL_TABLET | ORAL | Status: DC
  Administered 2016-03-27: 2 via ORAL
  Filled 2016-03-26: qty 2

## 2016-03-26 MED ORDER — SODIUM CHLORIDE 0.9 % IV SOLN: 250.0000 mL | INTRAVENOUS | Status: DC | PRN

## 2016-03-26 MED ORDER — FLEET ENEMA 7-19 GM/118ML RE ENEM: 1.0000 | ENEMA | RECTAL | Status: DC | PRN

## 2016-03-26 MED ORDER — PHENYLEPHRINE 40 MCG/ML (10ML) SYRINGE FOR IV PUSH (FOR BLOOD PRESSURE SUPPORT)
PREFILLED_SYRINGE | INTRAVENOUS | Status: AC
  Filled 2016-03-26: qty 20

## 2016-03-26 MED ORDER — OXYTOCIN 40 UNITS IN LACTATED RINGERS INFUSION - SIMPLE MED: 2.5000 [IU]/h | INTRAVENOUS | Status: DC

## 2016-03-26 MED ORDER — WITCH HAZEL-GLYCERIN EX PADS: 1.0000 "application " | MEDICATED_PAD | CUTANEOUS | Status: DC | PRN

## 2016-03-26 MED ORDER — LACTATED RINGERS IV SOLN: 500.0000 mL | INTRAVENOUS | Status: DC | PRN

## 2016-03-26 MED ORDER — LIDOCAINE HCL (PF) 1 % IJ SOLN
INTRAMUSCULAR | Status: DC | PRN
  Administered 2016-03-26: 4 mL
  Administered 2016-03-26: 6 mL via EPIDURAL

## 2016-03-26 MED ORDER — MEASLES, MUMPS & RUBELLA VAC ~~LOC~~ INJ: 0.5000 mL | INJECTION | Freq: Once | SUBCUTANEOUS | Status: DC

## 2016-03-26 MED ORDER — DIPHENHYDRAMINE HCL 50 MG/ML IJ SOLN: 12.5000 mg | INTRAMUSCULAR | Status: DC | PRN

## 2016-03-26 MED ORDER — OXYCODONE-ACETAMINOPHEN 5-325 MG PO TABS: 2.0000 | ORAL_TABLET | ORAL | Status: DC | PRN

## 2016-03-26 MED ORDER — SOD CITRATE-CITRIC ACID 500-334 MG/5ML PO SOLN: 30.0000 mL | ORAL | Status: DC | PRN

## 2016-03-26 MED ORDER — ONDANSETRON HCL 4 MG PO TABS: 4.0000 mg | ORAL_TABLET | ORAL | Status: DC | PRN

## 2016-03-26 MED ORDER — LACTATED RINGERS IV SOLN
INTRAVENOUS | Status: DC
  Administered 2016-03-26: 03:00:00 via INTRAVENOUS

## 2016-03-26 MED ORDER — BUTORPHANOL TARTRATE 1 MG/ML IJ SOLN: 1.0000 mg | INTRAMUSCULAR | Status: DC | PRN

## 2016-03-26 MED ORDER — TERBUTALINE SULFATE 1 MG/ML IJ SOLN
0.2500 mg | Freq: Once | INTRAMUSCULAR | Status: DC | PRN
  Filled 2016-03-26: qty 1

## 2016-03-26 NOTE — Anesthesia Postprocedure Evaluation (Signed)
Anesthesia Post Note  Patient: Kelly [name]  Procedure(s) Performed: * No procedures listed *  Patient location during evaluation: Mother Baby Anesthesia Type: Epidural Level of consciousness: awake and alert Pain management: pain level controlled Vital Signs Assessment: post-procedure vital signs reviewed and stable Respiratory status: spontaneous breathing Cardiovascular status: blood pressure returned to baseline Postop Assessment: no headache, patient able to bend at knees, no backache, no signs of nausea or vomiting, epidural receding and adequate PO intake        Last Vitals:  Vitals:   03/26/16 1235 03/26/16 1411  BP: 115/66 110/62  Pulse: 83 76  Resp: 20 20  Temp: 36.7 C 37 C    Last Pain:  Vitals:   03/26/16 1411  TempSrc: Oral  PainSc: 0-No pain   Pain Goal:                 Kelly [name]

## 2016-03-26 NOTE — H&P (Signed)
31 y.o. [redacted]w[redacted]d  G3P1011 comes in c/o labor.  Otherwise has good fetal movement and no bleeding.  Contractions are coming closer and more regular.  In office she was 4-5; now she is 5-6.  History reviewed. No pertinent past medical history.  Past Surgical History:  Procedure Laterality Date  . BREAST SURGERY    . WISDOM TOOTH EXTRACTION Bilateral     OB History  Gravida Para Term Preterm AB Living  3 1 1   1 1   SAB TAB Ectopic Multiple Live Births      1 0 1    # Outcome Date GA Lbr Len/2nd Weight Sex Delivery Anes PTL Lv  3 Current           2 Term 10/26/14 [redacted]w[redacted]d 12:00 / 00:40 3.459 kg (7 lb 10 oz) M Vag-Spont EPI  LIV  1 Ectopic               Social History   Social History  . Marital status: Married    Spouse name: N/A  . Number of children: N/A  . Years of education: N/A   Occupational History  . Not on file.   Social History Main Topics  . Smoking status: Never Smoker  . Smokeless tobacco: Never Used  . Alcohol use No  . Drug use: No  . Sexual activity: Yes    Birth control/ protection: None   Other Topics Concern  . Not on file   Social History Narrative  . No narrative on file   Patient has no known allergies.    Prenatal Transfer Tool  Maternal Diabetes: No Genetic Screening: Normal NIPT test; borderline abnormal AFP Maternal Ultrasounds/Referrals: Normal Fetal Ultrasounds or other Referrals:  Referred to Materal Fetal Medicine - [place] normal without abnormalities of the spine or fetal stomach Maternal Substance Abuse:  No Significant Maternal Medications:  Meds include: Progesterone Other: received BMZ at 29 weeks for PTL.   Significant Maternal Lab Results: Lab values include: Group B Strep positive  Other PNC: uncomplicated.    Vitals:   03/26/16 0013  BP: 121/73  Pulse: 81  Resp: 18  Temp: 97.4 F (36.3 C)     Lungs/Cor:  NAD Abdomen:  soft, gravid Ex:  no cords, erythema SVE:  5-6/80/-2, VTX FHTs:  120, good STV, NST R Toco:  q 3-4  min   A/P   [redacted]w[redacted]d with PTL, active labor.  GBS Pos- PCN.  Hanna Ra A

## 2016-03-26 NOTE — Anesthesia Preprocedure Evaluation (Signed)

## 2016-03-26 NOTE — Anesthesia Procedure Notes (Signed)
Epidural Patient location during procedure: OB Start time: 03/26/2016 1:20 AM End time: 03/26/2016 1:30 AM  Preanesthetic Checklist Completed: patient identified, site marked, surgical consent, pre-op evaluation, timeout performed, IV checked, risks and benefits discussed and monitors and equipment checked  Epidural Patient position: sitting Prep: site prepped and draped and DuraPrep Patient monitoring: continuous pulse ox and blood pressure Approach: midline Location: L3-L4 Injection technique: LOR air  Needle:  Needle type: Tuohy  Needle gauge: 17 G Needle length: 9 cm and 9 Needle insertion depth: 5 cm cm Catheter type: closed end flexible Catheter size: 19 Gauge Catheter at skin depth: 10 cm Test dose: negative  Assessment Events: blood not aspirated, injection not painful, no injection resistance, negative IV test and no paresthesia  Additional Notes Dosing of Epidural:  1st dose, through catheter ............................................Marland Kitchen  Xylocaine 40 mg  2nd dose, through catheter, after waiting 3 minutes........Marland KitchenXylocaine 60 mg    As each dose occurred, patient was free of IV sx; and patient exhibited no evidence of SA injection.  Patient is more comfortable after epidural dosed. Please see RN's note for documentation of vital signs,and FHR which are stable.  Patient reminded not to try to ambulate with numb legs, and that an RN must be present when she attempts to get up.

## 2016-03-26 NOTE — Lactation Note (Signed)
Lactation Consultation Note  Patient Name: Kelly [name] [id number] Date: 03/26/2016 Reason for consult: Initial assessment;Late preterm infant Breastfeeding consultation services and support information given to patient.  This is mom's second baby and newborn is 21 hours old.  Baby is 36.[redacted] weeks gestation.  Late preterm handout given and reviewed.  Parents both express concern over breastfeeding and how stressful it was with their now 43 month old son.  Mom stopped breastfeeding at 2 weeks. Baby is skin to skin with mom and showing feeding cues. Mom can easily hand express colostrum.  Positioned baby in both football and cross cradle hold but baby unable to latch.  FOB states it may be best to pump and bottle feed and mom agrees.  Mom willing to try nipple shield.  20 mm nipple shield applied and baby latched easily and nursed for 15 minutes.  Milk noted in shield when baby came off.  Parents would like to also give formula.  Instructed to give 5-57mls after breastfeeding if still showing feeding cues.  Symphony pump set up and mom will start pumping every 3 hours and give any expressed milk back to baby. Chart states mom has had previous breast surgery but family present so unable to ask her about it.  Will need to follow up later.  Encouraged to call for assist/concerns prn.  Maternal Data Has patient been taught Hand Expression?: Yes Does the patient have breastfeeding experience prior to this delivery?: Yes  Feeding Feeding Type: Breast Fed Length of feed: 15 min  LATCH Score/Interventions Latch: Grasps breast easily, tongue down, lips flanged, rhythmical sucking. (with 20 mm nipple shield)  Audible Swallowing: A few with stimulation Intervention(s): Skin to skin;Hand expression;Alternate breast massage  Type of Nipple: Everted at rest and after stimulation (short)  Comfort (Breast/Nipple): Soft / non-tender     Hold (Positioning): Assistance needed to correctly position infant at  breast and maintain latch.  LATCH Score: 8  Lactation Tools Discussed/Used Tools: Nipple Shields Nipple shield size: 20 Pump Review: Setup, frequency, and cleaning;Milk Storage Initiated by:: [place] Date initiated:: 03/26/16   Consult Status Consult Status: Follow-up Date: 03/27/16 Follow-up type: In-patient    Ave Filter 03/26/2016, 1:44 PM

## 2016-03-27 LAB — CBC
HEMATOCRIT: 29.7 % — AB (ref 36.0–46.0)
HEMOGLOBIN: 10.4 g/dL — AB (ref 12.0–15.0)
MCH: 32 pg (ref 26.0–34.0)
MCHC: 35 g/dL (ref 30.0–36.0)
MCV: 91.4 fL (ref 78.0–100.0)
Platelets: 174 10*3/uL (ref 150–400)
RBC: 3.25 MIL/uL — AB (ref 3.87–5.11)
RDW: 13.5 % (ref 11.5–15.5)
WBC: 11 10*3/uL — AB (ref 4.0–10.5)

## 2016-03-27 MED ORDER — FLUOXETINE HCL 20 MG PO CAPS
20.0000 mg | ORAL_CAPSULE | Freq: Every day | ORAL | Status: DC
  Administered 2016-03-27: 20 mg via ORAL
  Filled 2016-03-27: qty 1

## 2016-03-27 NOTE — Lactation Note (Signed)
This note was copied from a baby's chart. Lactation Consultation Note Mom decides to pump and bottle feed. Mom is supplementing w/formula as well. Pumping approximately a teaspoon of colostrum. Encouraged hand expression after pumping as well. Discussed supply and demand. Mom has DEBP at home, pumped and bottle fed for 2 weeks. Mom states she doesn't love the BF but wants the baby to have BM.  Encouraged to pump every 3 hrs 15-20 min. Cont. To document I&O.  Patient Name: Kelly [name] [id number] Date: 03/27/2016 Reason for consult: Follow-up assessment   Maternal Data Has patient been taught Hand Expression?: Yes Does the patient have breastfeeding experience prior to this delivery?: Yes  Feeding Feeding Type: Bottle Fed - Formula Nipple Type: Slow - flow  LATCH Score/Interventions                      Lactation Tools Discussed/Used Pump Review: Setup, frequency, and cleaning;Milk Storage Initiated by:: RN Date initiated:: 03/26/16   Consult Status Consult Status: Follow-up Date: 03/28/16 Follow-up type: In-patient    Theodoro Kalata 03/27/2016, 6:34 AM

## 2016-03-27 NOTE — Progress Notes (Signed)
Patient is eating, ambulating, voiding.  Pain control is good.  Vitals:   03/26/16 1411 03/26/16 1755 03/27/16 0200 03/27/16 0610  BP: 110/62 117/69 109/61 105/60  Pulse: 76 68 66 68  Resp: 20 20 18 18   Temp: 98.6 F (37 C) 98 F (36.7 C) 97.8 F (36.6 C) 97.7 F (36.5 C)  TempSrc: Oral Oral Oral Oral  SpO2: 98%     Weight:      Height:        Fundus firm Perineum without swelling.  Lab Results  Component Value Date   WBC 11.0 (H) 03/27/2016   HGB 10.4 (L) 03/27/2016   HCT 29.7 (L) 03/27/2016   MCV 91.4 03/27/2016   PLT 174 03/27/2016    --/--/A POS (01/08 0032)/RI  A/P Post partum day 1.  Routine care.  Expect d/c routine.    Kanija Remmel A

## 2016-03-27 NOTE — Plan of Care (Signed)
Problem: Coping: Goal: Ability to cope will improve Outcome: Progressing Hx: anx/ dep- on meds

## 2016-03-27 NOTE — Discharge Summary (Signed)
Obstetric Discharge Summary Reason for Admission: onset of labor, preterm Prenatal Procedures: NST Intrapartum Procedures: spontaneous vaginal delivery Postpartum Procedures: none Complications-Operative and Postpartum: none Hemoglobin  Date Value Ref Range Status  03/27/2016 10.4 (L) 12.0 - 15.0 g/dL Final   HGB  Date Value Ref Range Status  03/15/2014 13.6 12.0 - 16.0 g/dL Final   HCT  Date Value Ref Range Status  03/27/2016 29.7 (L) 36.0 - 46.0 % Final  03/15/2014 40.6 35.0 - 47.0 % Final    Discharge Diagnoses: Premature labor with delivery  At 36+ weeks  Discharge Information: Date: 03/27/2016 Activity: pelvic rest Diet: routine Medications: Ibuprofen Condition: stable Instructions: refer to practice specific booklet Discharge to: home Follow-up Information    [name], MD Follow up in 4 week(s).   Specialty:  Obstetrics and Gynecology Contact information: [place] [address] [phone]           Newborn Data: Live born female  Birth Weight: 6 lb 7.7 oz (2940 g) APGAR: 8, 9  Home with mother.  Kelly [name] A 03/27/2016, 7:55 AM

## 2016-03-28 NOTE — Progress Notes (Signed)
Discharge education complete, discharge instructions and follow up discussed patient verbalized understanding.

## 2016-03-28 NOTE — Lactation Note (Signed)
This note was copied from a baby's chart. Lactation Consultation Note  Mother wants to pump and bottle feed baby due to difficulty with latching.  Giving baby additional supplementation of formula. Mother pumping upon entering with DEBP.  Mother states she received 9 ml at last pumping session and gave baby an additional 20 ml of formula. Has personal DEBP at home. Encouraged mother to pump every 3 hours.  Hand expression before and after pumping will help increase her milk supply. Offered OP appt if she would like help with latching in the future. Reviewed engorgement care and monitoring voids/stools.   Patient Name: Kelly [name] [id number] Date: 03/28/2016 Reason for consult: Follow-up assessment   Maternal Data    Feeding Feeding Type: Breast Milk with Formula added Nipple Type: Slow - flow  LATCH Score/Interventions                      Lactation Tools Discussed/Used     Consult Status Consult Status: Complete    Carlye Grippe 03/28/2016, 10:13 AM

## 2018-06-17 ENCOUNTER — Encounter: Payer: Self-pay | Admitting: Emergency Medicine

## 2018-06-17 ENCOUNTER — Observation Stay
Admission: EM | Admit: 2018-06-17 | Discharge: 2018-06-18 | Disposition: A | Discharge status: Home / Self Care | Payer: BLUE CROSS/BLUE SHIELD | Attending: General Surgery | Admitting: General Surgery

## 2018-06-17 ENCOUNTER — Other Ambulatory Visit: Payer: Self-pay

## 2018-06-17 DIAGNOSIS — R109 Unspecified abdominal pain: Secondary | ICD-10-CM | POA: Diagnosis not present

## 2018-06-17 DIAGNOSIS — R197 Diarrhea, unspecified: Secondary | ICD-10-CM | POA: Diagnosis present

## 2018-06-17 DIAGNOSIS — Z79899 Other long term (current) drug therapy: Secondary | ICD-10-CM | POA: Insufficient documentation

## 2018-06-17 LAB — COMPREHENSIVE METABOLIC PANEL
ALBUMIN: 4.3 g/dL (ref 3.5–5.0)
ALT: 38 U/L (ref 0–44)
AST: 28 U/L (ref 15–41)
Alkaline Phosphatase: 50 U/L (ref 38–126)
Anion gap: 9 (ref 5–15)
BUN: 13 mg/dL (ref 6–20)
CHLORIDE: 106 mmol/L (ref 98–111)
CO2: 25 mmol/L (ref 22–32)
Calcium: 9.3 mg/dL (ref 8.9–10.3)
Creatinine, Ser: 0.66 mg/dL (ref 0.44–1.00)
GFR calc Af Amer: >60 mL/min (ref >60)
GFR calc non Af Amer: >60 mL/min (ref >60)
GLUCOSE: 102 mg/dL — AB (ref 70–99)
POTASSIUM: 4.4 mmol/L (ref 3.5–5.1)
SODIUM: 140 mmol/L (ref 135–145)
Total Bilirubin: 0.5 mg/dL (ref 0.3–1.2)
Total Protein: 7.8 g/dL (ref 6.5–8.1)

## 2018-06-17 LAB — CBC WITH DIFFERENTIAL/PLATELET
Abs Immature Granulocytes: 0.01 10*3/uL (ref 0.00–0.07)
Basophils Absolute: 0 10*3/uL (ref 0.0–0.1)
Basophils Relative: 0 %
Eosinophils Absolute: 0.1 10*3/uL (ref 0.0–0.5)
Eosinophils Relative: 1 %
HCT: 38 % (ref 36.0–46.0)
HEMOGLOBIN: 13.2 g/dL (ref 12.0–15.0)
Immature Granulocytes: 0 %
Lymphocytes Relative: 59 %
Lymphs Abs: 4.1 10*3/uL — ABNORMAL HIGH (ref 0.7–4.0)
MCH: 31.1 pg (ref 26.0–34.0)
MCHC: 34.7 g/dL (ref 30.0–36.0)
MCV: 89.4 fL (ref 80.0–100.0)
MONO ABS: 0.6 10*3/uL (ref 0.1–1.0)
MONOS PCT: 8 %
Neutro Abs: 2.3 10*3/uL (ref 1.7–7.7)
Neutrophils Relative %: 32 %
PLATELETS: 299 10*3/uL (ref 150–400)
RBC: 4.25 MIL/uL (ref 3.87–5.11)
RDW: 11.6 % (ref 11.5–15.5)
WBC: 7.1 10*3/uL (ref 4.0–10.5)
nRBC: 0 % (ref 0.0–0.2)

## 2018-06-17 LAB — URINALYSIS, COMPLETE (UACMP) WITH MICROSCOPIC
BILIRUBIN URINE: NEGATIVE
Bacteria, UA: NONE SEEN
Glucose, UA: NEGATIVE mg/dL
HGB URINE DIPSTICK: NEGATIVE
KETONES UR: NEGATIVE mg/dL
LEUKOCYTE UA: NEGATIVE
Nitrite: NEGATIVE
PH: 5 (ref 5.0–8.0)
Protein, ur: NEGATIVE mg/dL
SPECIFIC GRAVITY, URINE: 1.017 (ref 1.005–1.030)
WBC UA: NONE SEEN WBC/hpf (ref 0–5)

## 2018-06-17 LAB — LIPASE, BLOOD: Lipase: 22 U/L (ref 11–51)

## 2018-06-17 LAB — POCT PREGNANCY, URINE: Preg Test, Ur: NEGATIVE

## 2018-06-17 MED ORDER — SODIUM CHLORIDE 0.9 % IV BOLUS
1000.0000 mL | Freq: Once | INTRAVENOUS | Status: AC
  Administered 2018-06-17: 1000 mL via INTRAVENOUS

## 2018-06-17 MED ORDER — ONDANSETRON HCL 4 MG/2ML IJ SOLN: 4.0000 mg | Freq: Once | INTRAMUSCULAR | Status: DC

## 2018-06-17 NOTE — ED Triage Notes (Signed)
Patient ambulatory to triage with steady gait, without difficulty or distress noted; pt reports diarrhea since August, worse last few wks, now with N/V; denies any abd pain

## 2018-06-17 NOTE — ED Provider Notes (Signed)
[facility] Emergency Department Provider Note ________________________________   First MD Initiated Contact with Patient 06/17/18 2328     (approximate)  I have reviewed the triage vital signs and the nursing notes.   HISTORY  Chief Complaint Emesis and Diarrhea    HPI Kelly [name] is a 33 y.o. female with medical history as listed below with below list of previous medical conditions presents to the emergency department with chronic nonbloody diarrhea since August 2019.  Patient states that a few weeks ago her doctor started her on a probiotic and at which point the diarrhea worsened.  Patient states that she has had 12 episodes today.  Patient does admit to abdominal cramping however no fever.  Patient denies any urinary symptoms.  Patient does admit to vomiting today as well.        History reviewed. No pertinent past medical history.  Patient Active Problem List   Diagnosis Date Noted   Abdominal pain 06/18/2018   Preterm labor without delivery, third trimester 03/26/2016   Preterm uterine contractions in third trimester, antepartum 02/01/2016   Postpartum state 10/26/2014   Normal labor and delivery 10/25/2014   [redacted] weeks gestation of pregnancy    Preterm labor in third trimester without delivery 09/14/2014    Past Surgical History:  Procedure Laterality Date   BREAST SURGERY     WISDOM TOOTH EXTRACTION Bilateral     Prior to Admission medications   Medication Sig Start Date End Date Taking? Authorizing Provider  FLUoxetine (PROZAC) 20 MG capsule Take 20 mg by mouth at bedtime.    [provider]  folic acid (FOLVITE) 542 MCG tablet Take 800 mcg by mouth at bedtime.     [provider]  Prenatal Vit-Fe Fumarate-FA (PRENATAL MULTIVITAMIN) TABS tablet Take 1 tablet by mouth at bedtime.     [provider]    Allergies Patient has no known allergies.  Family History  Problem Relation Age of  Onset   Diabetes Paternal Grandmother    Hyperlipidemia Maternal Grandmother     Social History Social History   Tobacco Use   Smoking status: Never Smoker   Smokeless tobacco: Never Used  Substance Use Topics   Alcohol use: No   Drug use: No    Review of Systems Constitutional: No fever/chills Eyes: No visual changes. ENT: No sore throat. Cardiovascular: Denies chest pain. Respiratory: Denies shortness of breath. Gastrointestinal: No abdominal pain.  No nausea, no vomiting.  No diarrhea.  No constipation. Genitourinary: Negative for dysuria. Musculoskeletal: Negative for neck pain.  Negative for back pain. Integumentary: Negative for rash. Neurological: Negative for headaches, focal weakness or numbness.   ____________________________________________   PHYSICAL EXAM:  VITAL SIGNS: ED Triage Vitals  Enc Vitals Group     BP 06/17/18 2309 109/74     Pulse Rate 06/17/18 2309 86     Resp 06/17/18 2309 18     Temp 06/17/18 2309 98.1 F (36.7 C)     Temp Source 06/17/18 2309 Oral     SpO2 06/17/18 2309 99 %     Weight 06/17/18 2306 68 kg (150 lb)     Height 06/17/18 2306 1.626 m (5\' 4" )     Head Circumference --      Peak Flow --      Pain Score 06/17/18 2308 0     Pain Loc --      Pain Edu? --      Excl. in [place]? --  Constitutional: Alert and oriented. Well appearing and in no acute distress. Eyes: Conjunctivae are normal. Mouth/Throat: Mucous membranes are moist.  Oropharynx non-erythematous. Neck: No stridor.   Cardiovascular: Normal rate, regular rhythm. Good peripheral circulation. Grossly normal heart sounds. Respiratory: Normal respiratory effort.  No retractions. Lungs CTAB. Gastrointestinal: Soft and nontender. No distention.  Musculoskeletal: No lower extremity tenderness nor edema. No gross deformities of extremities. Neurologic:  Normal speech and language. No gross focal neurologic deficits are appreciated.  Skin:  Skin is warm, dry and  intact. No rash noted. Psychiatric: Mood and affect are normal. Speech and behavior are normal.  ____________________________________________   LABS (all labs ordered are listed, but only abnormal results are displayed)  Labs Reviewed  CBC WITH DIFFERENTIAL/PLATELET - Abnormal; Notable for the following components:      Result Value   Lymphs Abs 4.1 (*)    All other components within normal limits  COMPREHENSIVE METABOLIC PANEL - Abnormal; Notable for the following components:   Glucose, Bld 102 (*)    All other components within normal limits  URINALYSIS, COMPLETE (UACMP) WITH MICROSCOPIC - Abnormal; Notable for the following components:   Color, Urine YELLOW (*)    APPearance CLEAR (*)    All other components within normal limits  C DIFFICILE QUICK SCREEN W PCR REFLEX  GASTROINTESTINAL PANEL BY PCR, STOOL (REPLACES STOOL CULTURE)  OVA + PARASITE EXAM  LIPASE, BLOOD  HIV ANTIBODY (ROUTINE TESTING W REFLEX)  CHROMOGRANIN A  POCT PREGNANCY, URINE     RADIOLOGY I, Lushton N Dequon Schnebly, personally viewed and evaluated these images (plain radiographs) as part of my medical decision making, as well as reviewing the written report by the radiologist.  ED MD interpretation: Distended appendix 1.2 cm without any surrounding fat stranding or free fluid per radiologist on CT abdomen pelvis interpretation.  Official radiology report(s): Ct Abdomen Pelvis W Contrast  Result Date: 06/18/2018 CLINICAL DATA:  diarrhea since August worsening over the last few weeks. Now with nausea and vomiting. EXAM: CT ABDOMEN AND PELVIS WITH CONTRAST TECHNIQUE: Multidetector CT imaging of the abdomen and pelvis was performed using the standard protocol following bolus administration of intravenous contrast. CONTRAST:  146mL OMNIPAQUE IOHEXOL 300 MG/ML  SOLN COMPARISON:  None. FINDINGS: Lower chest: No acute abnormality. Hepatobiliary: No focal liver abnormality is seen. No gallstones, gallbladder wall  thickening, or biliary dilatation. Pancreas: Unremarkable. No pancreatic ductal dilatation or surrounding inflammatory changes. Spleen: Normal in size without focal abnormality. Adrenals/Urinary Tract: Adrenal glands are unremarkable. Kidneys are normal, without renal calculi, focal lesion, or hydronephrosis. Bladder is unremarkable. Stomach/Bowel: Stomach normal. The small bowel loops are normal in caliber without obstruction. The appendix is visualized within the right lower quadrant of the abdomen and iliac fossa. The appendix is abnormally distended with high attenuation material measuring 1.2 cm. No significant appendiceal wall thickening or surrounding inflammation or free fluid. Normal appearance of the colon Vascular/Lymphatic: Normal appearance of the abdominal aorta. No enlarged retroperitoneal or mesenteric adenopathy. No enlarged pelvic or inguinal lymph nodes. Reproductive: Uterus and bilateral adnexa are unremarkable. Other: No abdominal wall hernia or abnormality. No abdominopelvic ascites. Musculoskeletal: No acute or significant osseous findings. IMPRESSION: 1. The appendix appears abnormally distended containing high attenuation material measuring 1.2 cm in diameter (normal 6 mm or less). No surrounding fat stranding or free fluid however. Correlate for any clinical signs or symptoms of acute appendicitis. 2. Examination is otherwise unremarkable. No findings to suggest colitis. Electronically Signed   By: Queen Slough.D.  On: 06/18/2018 00:26      Procedures   ____________________________________________   INITIAL IMPRESSION / MDM / ASSESSMENT AND PLAN / ED COURSE  As part of my medical decision making, I reviewed the following data within the electronic MEDICAL RECORD NUMBER   33 year old female present with above-stated history and physical exam secondary to chronic diarrhea and vomiting tonight with generalized abdominal cramping.  Considered possibly of infectious diarrhea  however stool sample revealed no pathogen including C. difficile.  In addition CT scan of the abdomen pelvis performed which revealed a dilated appendix at 1.2 cm.  Patient subsequently discussed with Dr. Peyton Najjar general surgeon on-call who evaluated patient in the emergency department and will admit the patient for observation.     ____________________________________________  FINAL CLINICAL IMPRESSION(S) / ED DIAGNOSES  Final diagnoses:  Diarrhea, unspecified type  Abdominal pain, unspecified abdominal location     MEDICATIONS GIVEN DURING THIS VISIT:  Medications  FLUoxetine (PROZAC) capsule 20 mg (has no administration in time range)  folic acid (FOLVITE) tablet 0.5 mg (has no administration in time range)  0.9 %  sodium chloride infusion (has no administration in time range)  acetaminophen (TYLENOL) tablet 650 mg (has no administration in time range)    Or  acetaminophen (TYLENOL) suppository 650 mg (has no administration in time range)  ondansetron (ZOFRAN-ODT) disintegrating tablet 4 mg (has no administration in time range)    Or  ondansetron (ZOFRAN) injection 4 mg (has no administration in time range)  sodium chloride 0.9 % bolus 1,000 mL (0 mLs Intravenous Stopped 06/18/18 0151)  iohexol (OMNIPAQUE) 300 MG/ML solution 100 mL (100 mLs Intravenous Contrast Given 06/18/18 0012)     ED Discharge Orders    None       Note:  This document was prepared using Dragon voice recognition software and may include unintentional dictation errors.   Gregor Hams, MD 06/18/18 [phone]

## 2018-06-18 ENCOUNTER — Encounter: Payer: Self-pay | Admitting: Radiology

## 2018-06-18 ENCOUNTER — Emergency Department: Payer: BLUE CROSS/BLUE SHIELD

## 2018-06-18 DIAGNOSIS — R109 Unspecified abdominal pain: Secondary | ICD-10-CM | POA: Diagnosis present

## 2018-06-18 LAB — GASTROINTESTINAL PANEL BY PCR, STOOL (REPLACES STOOL CULTURE)

## 2018-06-18 LAB — SURGICAL PCR SCREEN
MRSA, PCR: NEGATIVE
Staphylococcus aureus: POSITIVE — AB

## 2018-06-18 LAB — C DIFFICILE QUICK SCREEN W PCR REFLEX
C DIFFICILE (CDIFF) TOXIN: NEGATIVE
C DIFFICLE (CDIFF) ANTIGEN: NEGATIVE
C Diff interpretation: NOT DETECTED

## 2018-06-18 MED ORDER — FLUOXETINE HCL 20 MG PO CAPS
20.0000 mg | ORAL_CAPSULE | Freq: Every day | ORAL | Status: DC
  Filled 2018-06-18: qty 1

## 2018-06-18 MED ORDER — MUPIROCIN 2 % EX OINT
1.0000 "application " | TOPICAL_OINTMENT | Freq: Two times a day (BID) | CUTANEOUS | Status: DC
  Filled 2018-06-18: qty 22

## 2018-06-18 MED ORDER — SERTRALINE HCL 50 MG PO TABS: 50.0000 mg | ORAL_TABLET | Freq: Every day | ORAL | Status: DC

## 2018-06-18 MED ORDER — IOHEXOL 300 MG/ML  SOLN
100.0000 mL | Freq: Once | INTRAMUSCULAR | Status: AC | PRN
  Administered 2018-06-18: 100 mL via INTRAVENOUS

## 2018-06-18 MED ORDER — ACETAMINOPHEN 650 MG RE SUPP: 650.0000 mg | Freq: Four times a day (QID) | RECTAL | Status: DC | PRN

## 2018-06-18 MED ORDER — ACETAMINOPHEN 325 MG PO TABS: 650.0000 mg | ORAL_TABLET | Freq: Four times a day (QID) | ORAL | Status: DC | PRN

## 2018-06-18 MED ORDER — COLESTIPOL HCL 1 G PO TABS: 1.0000 g | ORAL_TABLET | Freq: Two times a day (BID) | ORAL | 0 refills | Status: DC

## 2018-06-18 MED ORDER — FOLIC ACID 1 MG PO TABS: 500.0000 ug | ORAL_TABLET | Freq: Every day | ORAL | Status: DC

## 2018-06-18 MED ORDER — SODIUM CHLORIDE 0.9 % IV SOLN
INTRAVENOUS | Status: DC
  Administered 2018-06-18: 1000 mL via INTRAVENOUS
  Administered 2018-06-18: 09:00:00 via INTRAVENOUS

## 2018-06-18 MED ORDER — MUPIROCIN 2 % EX OINT
TOPICAL_OINTMENT | Freq: Two times a day (BID) | CUTANEOUS | Status: DC
  Filled 2018-06-18: qty 22

## 2018-06-18 MED ORDER — ONDANSETRON 4 MG PO TBDP: 4.0000 mg | ORAL_TABLET | Freq: Four times a day (QID) | ORAL | Status: DC | PRN

## 2018-06-18 MED ORDER — ONDANSETRON HCL 4 MG/2ML IJ SOLN: 4.0000 mg | Freq: Four times a day (QID) | INTRAMUSCULAR | Status: DC | PRN

## 2018-06-18 NOTE — Consult Note (Signed)
GI Inpatient Consult Note  Reason for Consult: Chronic diarrhea, nausea/vomiting    Attending Requesting Consult: Dr. Windell Moment  History of Present Illness: Kelly [name] is a 33 y.o. female seen for evaluation of chronic diarrhea at the request of Dr. Windell Moment, MD. Pt presented to the [facility] ED last night for 1-day history of progressive worsening nausea and vomiting. She also reports a history of chronic diarrhea over the past 8 months, seeming to worsen over the past 3 weeks. On average, she reports 10-12 watery BMs daily. Labs showed no leukocytosis and no significant electrolyte abnormalities.  There is no acidosis with adequate renal function.  CT scan abdomen was done which was essentially negative except for a dilated appendix without inflammatory surroundings. She reports before August 2019 she would have 1-2 formed BMs daily w/o any urgency, incontinence, or sensations of incomplete evacuation. She reports since Aug 2019 she has had progressive worsening chronic diarrhea. Symptoms seem to be much worse over the past three weeks. She denies any sick contacts at home. She does report some dietary intolerances to dairy products but she hasn't stopped consuming them. She is concerned about a possible gluten allergy as she does consume gluten frequently. She denies any known family hx of colon cancer, adenomatous polyps, or inflammatory bowel disease. She denies laxative abuse. No known thyroid issues. She has had frequent nocturnal diarrhea (2-3 episodes per night) over the past three weeks. She has had a total of 4 episodes of fecal incontinence since August. Preceding most BMs she will experience some lower abd cramping and urgency which is relieved upon defecation. She denies any bright red bloody or dark, tarry stools. A fast does not seem to make the diarrhea any better. She has noticed some post-prandial diarrhea - appx 15-25 min after eating she will frequently find herself having to  use the restroom. She denies any fevers, frequent mouth ulcers, new skin lesions, uveitis. She started a probiotic several weeks ago, but this made her diarrhea worse so she stopped it. She reports some minor mild (2-3) RLQ abd pain today when someone presses there. Nausea is much better today on anti-emetics.    Last Colonoscopy: N/A Last Endoscopy: N/A   Past Medical History:  History reviewed. No pertinent past medical history.  Problem List: Patient Active Problem List   Diagnosis Date Noted  . Abdominal pain 06/18/2018  . Preterm labor without delivery, third trimester 03/26/2016  . Preterm uterine contractions in third trimester, antepartum 02/01/2016  . Postpartum state 10/26/2014  . Normal labor and delivery 10/25/2014  . [redacted] weeks gestation of pregnancy   . Preterm labor in third trimester without delivery 09/14/2014    Past Surgical History: Past Surgical History:  Procedure Laterality Date  . BREAST SURGERY    . WISDOM TOOTH EXTRACTION Bilateral     Allergies: No Known Allergies  Home Medications: Medications Prior to Admission  Medication Sig Dispense Refill Last Dose  . cetirizine (ZYRTEC) 10 MG tablet Take 10 mg by mouth at bedtime.   Past Week at Unknown time  . sertraline (ZOLOFT) 50 MG tablet Take 50 mg by mouth at bedtime.   Past Week at Unknown time  . FLUoxetine (PROZAC) 20 MG capsule Take 20 mg by mouth at bedtime.   Not Taking at Unknown time  . folic acid (FOLVITE) 160 MCG tablet Take 800 mcg by mouth at bedtime.    Not Taking at Unknown time  . Prenatal Vit-Fe Fumarate-FA (PRENATAL MULTIVITAMIN) TABS tablet Take 1 tablet  by mouth at bedtime.    Not Taking at Unknown time   Home medication reconciliation was completed with the patient.   Scheduled Inpatient Medications:   . FLUoxetine  20 mg Oral QHS  . folic acid  536 mcg Oral QHS  . mupirocin ointment  1 application Nasal BID    Continuous Inpatient Infusions:   . sodium chloride 50 mL/hr at  06/18/18 0843    PRN Inpatient Medications:  acetaminophen **OR** acetaminophen, ondansetron **OR** ondansetron (ZOFRAN) IV  Family History: family history includes Diabetes in her paternal grandmother; Hyperlipidemia in her maternal grandmother.  The patient's family history is negative for inflammatory bowel disorders, GI malignancy, or solid organ transplantation.  Social History:   reports that she has never smoked. She has never used smokeless tobacco. She reports that she does not drink alcohol or use drugs. The patient denies ETOH, tobacco, or drug use.   Review of Systems: Constitutional: Weight is stable.  Eyes: No changes in vision. ENT: No oral lesions, sore throat.  GI: see HPI.  Heme/Lymph: No easy bruising.  CV: No chest pain.  GU: No hematuria.  Integumentary: No rashes.  Neuro: No headaches.  Psych: No depression/anxiety.  Endocrine: No heat/cold intolerance.  Allergic/Immunologic: No urticaria.  Resp: No cough, SOB.  Musculoskeletal: No joint swelling.    Physical Examination: BP 110/69 (BP Location: Right Arm)   Pulse 84   Temp 98.5 F (36.9 C) (Oral)   Resp 16   Ht 5\' 4"  (1.626 m)   Wt 68 kg   LMP  (Exact Date)   SpO2 100%   BMI 25.75 kg/m  Gen: NAD, alert and oriented x 4 HEENT: PEERLA, EOMI, Neck: supple, no JVD or thyromegaly Chest: CTA bilaterally, no wheezes, crackles, or other adventitious sounds CV: RRR, no m/g/c/r Abd: soft, very minimal tenderness to deep palpation in RLQ, ND, +BS in all four quadrants; no HSM, guarding, ridigity, or rebound tenderness Ext: no edema, well perfused with 2+ pulses, Skin: no rash or lesions noted Lymph: no LAD  Data: Lab Results  Component Value Date   WBC 7.1 06/17/2018   HGB 13.2 06/17/2018   HCT 38.0 06/17/2018   MCV 89.4 06/17/2018   PLT 299 06/17/2018   Recent Labs  Lab 06/17/18 2309  HGB 13.2   Lab Results  Component Value Date   NA 140 06/17/2018   K 4.4 06/17/2018   CL 106 06/17/2018    CO2 25 06/17/2018   BUN 13 06/17/2018   CREATININE 0.66 06/17/2018   Lab Results  Component Value Date   ALT 38 06/17/2018   AST 28 06/17/2018   ALKPHOS 50 06/17/2018   BILITOT 0.5 06/17/2018   No results for input(s): APTT, INR, PTT in the last 168 hours. Assessment/Plan:  33 y/o Caucasian female admitted for non-intractable nausea, vomiting, abdominal pain, and chronic diarrhea  1. Chronic diarrhea of unknown etiology 2. Bilateral lower abd pain 3. Nocturnal diarrhea  - Chronic diarrhea since Aug 2019, worsening over the past 3 weeks - Differential includes irritable bowel syndrome, inflammatory bowel disease, infectious etiology, exocrine pancreatic insufficiency, carcinoid syndrome, microscopic colitis, small bowel enteropathies, SIBO, dietary intolerances, autonomic neuropathy, or malabsorption syndromes such as Celiac disease - GI PCR and c diff are negative - We will check a fecal calprotectin, stool electrolytes to obtain if chronic diarrhea is osmotic vs secretory, celiac disease panel - No need for emergent luminal evaluation at this point in time, but patient would likely benefit from further  GI work-up as an outpatient with colonoscopy - Continue symptomatic care with anti-emetics - Surgery is following, doesn't appear to be need for emergent appendectomy - Further recs after stool tests and other laboratory results   Thank you for the consult. Please call with questions or concerns.  Reeves Forth [facility] [phone] (Cell)

## 2018-06-18 NOTE — Discharge Instructions (Signed)
Abdominal Bloating °When you have abdominal bloating, your abdomen may feel full, tight, or painful. It may also look bigger than normal or swollen (distended). Common causes of abdominal bloating include: °· Swallowing air. °· Constipation. °· Problems digesting food. °· Eating too much. °· Irritable bowel syndrome. This is a condition that affects the large intestine. °· Lactose intolerance. This is an inability to digest lactose, a natural sugar in dairy products. °· Celiac disease. This is a condition that affects the ability to digest gluten, a protein found in some grains. °· Gastroparesis. This is a condition that slows down the movement of food in the stomach and small intestine. It is more common in people with diabetes mellitus. °· Gastroesophageal reflux disease (GERD). This is a digestive condition that makes stomach acid flow back into the esophagus. °· Urinary retention. This means that the body is holding onto urine, and the bladder cannot be emptied all the way. °Follow these instructions at home: °Eating and drinking °· Avoid eating too much. °· Try not to swallow air while talking or eating. °· Avoid eating while lying down. °· Avoid these foods and drinks: °? Foods that cause gas, such as broccoli, cabbage, cauliflower, and baked beans. °? Carbonated drinks. °? Hard candy. °? Chewing gum. °Medicines °· Take over-the-counter and prescription medicines only as told by your health care provider. °· Take probiotic medicines. These medicines contain live bacteria or yeasts that can help digestion. °· Take coated peppermint oil capsules. °Activity °· Try to exercise regularly. Exercise may help to relieve bloating that is caused by gas and relieve constipation. °General instructions °· Keep all follow-up visits as told by your health care provider. This is important. °Contact a health care provider if: °· You have nausea and vomiting. °· You have diarrhea. °· You have abdominal pain. °· You have unusual  weight loss or weight gain. °· You have severe pain, and medicines do not help. °Get help right away if: °· You have severe chest pain. °· You have trouble breathing. °· You have shortness of breath. °· You have trouble urinating. °· You have darker urine than normal. °· You have blood in your stools or have dark, tarry stools. °Summary °· Abdominal bloating means that the abdomen is swollen. °· Common causes of abdominal bloating are swallowing air, constipation, and problems digesting food. °· Avoid eating too much and avoid swallowing air. °· Avoid foods that cause gas, carbonated drinks, hard candy, and chewing gum. °This information is not intended to replace advice given to you by your health care provider. Make sure you discuss any questions you have with your health care provider. °Document Released: 04/06/2016 Document Revised: 04/06/2016 Document Reviewed: 04/06/2016 °Elsevier Interactive Patient Education © 2019 [employer] ° °

## 2018-06-18 NOTE — H&P (Signed)
SURGICAL HISTORY AND PHYSICAL NOTE   HISTORY OF PRESENT ILLNESS (HPI):  33 y.o. female presented to [facility] ED for evaluation of nausea and vomiting. Patient reports started having nausea and vomiting since yesterday.  She reports that she was having diarrhea for about 52-months.  The diarrhea has been intermittent but in the last 3-week he has been more constant.  She reports 10-12 bowel movements per day and described as watery.  Denies rectal bleeding.  She was trying some probiotics and some some changes in her diet to treat the diarrhea but it was not causing any change.  What really worries her was that she started with the nausea and vomiting yesterday.  She reports crampy, bloating abdominal pain that improves with bowel movement.  States she is having 10-12 bowel movement per day this pain is almost all day.  Denies any localized abdominal pain.  Denies any radiation of pain.  There is no alleviating or aggravating factor.  Denies fever or chills.  At the ED she had some labs done which shows no leukocytosis and no significant electrolyte disturbance.  There is no acidosis either adequate renal function.  CT scan of the abdomen was done which was essentially negative except for a dilated appendix without inflammatory surroundings.  Personally reviewed the images.  Surgery is consulted by Dr. Owens Shark in this context for evaluation and management of abdominal pain with suspected appendicitis.Marland Kitchen  PAST MEDICAL HISTORY (PMH):  History reviewed. No pertinent past medical history.   PAST SURGICAL HISTORY ([place]):  Past Surgical History:  Procedure Laterality Date  . BREAST SURGERY    . WISDOM TOOTH EXTRACTION Bilateral      MEDICATIONS:  Prior to Admission medications   Medication Sig Start Date End Date Taking? Authorizing Provider  FLUoxetine (PROZAC) 20 MG capsule Take 20 mg by mouth at bedtime.    [provider]  folic acid (FOLVITE) 315 MCG tablet Take 800 mcg by mouth at bedtime.      [provider]  Prenatal Vit-Fe Fumarate-FA (PRENATAL MULTIVITAMIN) TABS tablet Take 1 tablet by mouth at bedtime.     [provider]     ALLERGIES:  No Known Allergies   SOCIAL HISTORY:  Social History   Socioeconomic History  . Marital status: Married    Spouse name: Not on file  . Number of children: Not on file  . Years of education: Not on file  . Highest education level: Not on file  Occupational History  . Not on file  Social Needs  . Financial resource strain: Not on file  . Food insecurity:    Worry: Not on file    Inability: Not on file  . Transportation needs:    Medical: Not on file    Non-medical: Not on file  Tobacco Use  . Smoking status: Never Smoker  . Smokeless tobacco: Never Used  Substance and Sexual Activity  . Alcohol use: No  . Drug use: No  . Sexual activity: Yes    Birth control/protection: None  Lifestyle  . Physical activity:    Days per week: Not on file    Minutes per session: Not on file  . Stress: Not on file  Relationships  . Social connections:    Talks on phone: Not on file    Gets together: Not on file    Attends religious service: Not on file    Active member of club or organization: Not on file    Attends meetings of clubs  or organizations: Not on file    Relationship status: Not on file  . Intimate partner violence:    Fear of current or ex partner: Not on file    Emotionally abused: Not on file    Physically abused: Not on file    Forced sexual activity: Not on file  Other Topics Concern  . Not on file  Social History Narrative  . Not on file    The patient currently resides (home / rehab facility / nursing home): Home The patient normally is (ambulatory / bedbound): Ambulatory   FAMILY HISTORY:  Family History  Problem Relation Age of Onset  . Diabetes Paternal Grandmother   . Hyperlipidemia Maternal Grandmother      REVIEW OF SYSTEMS:  Constitutional: denies weight loss, fever, chills, or  sweats  Eyes: denies any other vision changes, history of eye injury  ENT: denies sore throat, hearing problems  Respiratory: denies shortness of breath, wheezing  Cardiovascular: denies chest pain, palpitations  Gastrointestinal: Positive abdominal pain, N/V, and diarrhea Genitourinary: denies burning with urination or urinary frequency Musculoskeletal: denies any other joint pains or cramps  Skin: denies any other rashes or skin discolorations  Neurological: denies any other headache, dizziness, weakness  Psychiatric: denies any other depression, anxiety   All other review of systems were negative   VITAL SIGNS:  Temp:  [98.1 F (36.7 C)] 98.1 F (36.7 C) (03/31 2309) Pulse Rate:  [78-86] 80 (04/01 0352) Resp:  [18] 18 (04/01 0352) BP: (96-110)/(58-74) 96/61 (04/01 0352) SpO2:  [98 %-100 %] 98 % (04/01 0352) Weight:  [68 kg] 68 kg (03/31 2306)     Height: 5\' 4"  (162.6 cm) Weight: 68 kg BMI (Calculated): 25.73   INTAKE/OUTPUT:  This shift: Total I/O In: 1000 [IV Piggyback:1000] Out: -   Last 2 shifts: @IOLAST2SHIFTS @   PHYSICAL EXAM:  Constitutional:  -- Normal body habitus  -- Awake, alert, and oriented x3  Eyes:  -- Pupils equally round and reactive to light  -- No scleral icterus  Ear, nose, and throat:  -- No jugular venous distension  Pulmonary:  -- No crackles  -- Equal breath sounds bilaterally -- Breathing non-labored at rest Cardiovascular:  -- S1, S2 present  -- No pericardial rubs Gastrointestinal:  -- Abdomen soft, very mild tender on right lower quadrant, non-distended, no guarding or rebound tenderness -- No abdominal masses appreciated, pulsatile or otherwise  Musculoskeletal and Integumentary:  -- Wounds or skin discoloration: None appreciated -- Extremities: B/L UE and LE FROM, hands and feet warm, no edema  Neurologic:  -- Motor function: intact and symmetric -- Sensation: intact and symmetric   Labs:  CBC Latest Ref Rng & Units 06/17/2018  03/27/2016 03/26/2016  WBC 4.0 - 10.5 K/uL 7.1 11.0(H) 9.8  Hemoglobin 12.0 - 15.0 g/dL 13.2 10.4(L) 11.8(L)  Hematocrit 36.0 - 46.0 % 38.0 29.7(L) 34.1(L)  Platelets 150 - 400 K/uL 299 174 224   CMP Latest Ref Rng & Units 06/17/2018 03/15/2014  Glucose 70 - 99 mg/dL 102(H) 101(H)  BUN 6 - 20 mg/dL 13 12  Creatinine 0.44 - 1.00 mg/dL 0.66 0.60  Sodium 135 - 145 mmol/L 140 138  Potassium 3.5 - 5.1 mmol/L 4.4 3.6  Chloride 98 - 111 mmol/L 106 104  CO2 22 - 32 mmol/L 25 26  Calcium 8.9 - 10.3 mg/dL 9.3 9.0  Total Protein 6.5 - 8.1 g/dL 7.8 7.8  Total Bilirubin 0.3 - 1.2 mg/dL 0.5 0.3  Alkaline Phos 38 - 126 U/L  50 63  AST 15 - 41 U/L 28 25  ALT 0 - 44 U/L 38 33    Imaging studies:  EXAM: CT ABDOMEN AND PELVIS WITH CONTRAST  TECHNIQUE: Multidetector CT imaging of the abdomen and pelvis was performed using the standard protocol following bolus administration of intravenous contrast.  CONTRAST:  156mL OMNIPAQUE IOHEXOL 300 MG/ML  SOLN  COMPARISON:  None.  FINDINGS: Lower chest: No acute abnormality.  Hepatobiliary: No focal liver abnormality is seen. No gallstones, gallbladder wall thickening, or biliary dilatation.  Pancreas: Unremarkable. No pancreatic ductal dilatation or surrounding inflammatory changes.  Spleen: Normal in size without focal abnormality.  Adrenals/Urinary Tract: Adrenal glands are unremarkable. Kidneys are normal, without renal calculi, focal lesion, or hydronephrosis. Bladder is unremarkable.  Stomach/Bowel: Stomach normal. The small bowel loops are normal in caliber without obstruction. The appendix is visualized within the right lower quadrant of the abdomen and iliac fossa. The appendix is abnormally distended with high attenuation material measuring 1.2 cm. No significant appendiceal wall thickening or surrounding inflammation or free fluid. Normal appearance of the colon  Vascular/Lymphatic: Normal appearance of the abdominal aorta.  No enlarged retroperitoneal or mesenteric adenopathy. No enlarged pelvic or inguinal lymph nodes.  Reproductive: Uterus and bilateral adnexa are unremarkable.  Other: No abdominal wall hernia or abnormality. No abdominopelvic ascites.  Musculoskeletal: No acute or significant osseous findings.  IMPRESSION: 1. The appendix appears abnormally distended containing high attenuation material measuring 1.2 cm in diameter (normal 6 mm or less). No surrounding fat stranding or free fluid however. Correlate for any clinical signs or symptoms of acute appendicitis. 2. Examination is otherwise unremarkable. No findings to suggest colitis.   Electronically Signed   By: Kerby Moors M.D.   On: 06/18/2018 00:26  Assessment/Plan:  33 y.o. female with abdominal pain and suspected appendicitis  Patient presented to the ED with an unusual presentation of appendicitis.  She does not have localized right lower quadrant pain.  Her pain as she described is crampy and bloating and associated with the diarrhea.  She denies fever or chills.  The probably diarrhea has been present since 8 months ago.  There is no fever or chills.  There is no leukocytosis.  CT scan shows dilation of the appendix without fat stranding or any fluid surrounding the appendix.  This is a very unusual case that is not consistent with the usual history of acute appendicitis.  Acute appendicitis would not explain 8 months of intermittent diarrhea.  It may explain the nausea and vomiting that she has yesterday but she does not have right lower current pain.  The CT scan shows dilation of the appendix but there is no fat stranding as it is usually seen on acute appendicitis.  I considered that the patient may be having a chronic diarrhea syndrome has to be diagnosed because she has not seen an internal medicine doctor or a gastroenterologist for work-up.  Seems in the differential diagnosis, acute appendicitis 1 of the  possibilities, I will admit the patient for observation with serial physical exam.  I am also considering the differential etiologies such as carcinoid syndrome that can cause unexplained diarrhea with that appendix mass.  The appearance of the appendix is not of a mass but is still in the of inflamed appendix.  We will hydrate patient through the IV, encouraged to ambulate and be aware if the pain is getting better or worse and if the nausea recurs.  Depending on her symptoms and physical exam I  will again discuss with her the possibility of doing an appendectomy understanding that most likely this will not treat her diarrhea.  Arnold Long, MD

## 2018-06-18 NOTE — ED Notes (Signed)
Patient to ct and back. Awaiting results for further plan of care

## 2018-06-18 NOTE — ED Notes (Signed)
STOOL SPECIMEN OBTAINED AND SENT TO LAB.

## 2018-06-18 NOTE — Discharge Summary (Addendum)
  Patient ID: Kelly [name] MRN: [id number] DOB/AGE: 07-18-1985 33 y.o.  Admit date: 06/17/2018 Discharge date: 06/18/2018   Discharge Diagnoses:  Active Problems:   Abdominal pain   Procedures: None  Hospital Course: Patient admitted due to cramping abdominal pain and nausea.  Upon history patient has reported that she has been having diarrhea, 10-12 bowel movement per days since more than 6 months ago but has intensified in the last few weeks.  CT scan shows dilation of the appendix without any sign of inflammation.  There is no fat stranding or free fluid around the appendix.  On physical exam patient does not have right lower quadrant pain.  Patient has not have any fever or chills.  The labs showed no leukocytosis.  I admit her for observation and the pain did not got worse.  Patient tolerated diet.  I consulted GI service for recommendation regarding her chronic diarrhea and multiple lab studies were ordered from stool.  They recommended to follow-up as outpatient.  There was no need of appendectomy at this moment patient know that if she develop recurrent pain, fever or any other signs different and diarrhea she will need to come back to the hospital for evaluation.  Physical Exam  Constitutional: She is oriented to person, place, and time and well-developed, well-nourished, and in no distress.  Cardiovascular: Normal rate and regular rhythm.  Pulmonary/Chest: Effort normal.  Abdominal: Soft. Bowel sounds are normal. She exhibits no distension. There is no abdominal tenderness.  Neurological: She is alert and oriented to person, place, and time.     Consults: Gastroenterology, Dr. Alice Reichert.   Disposition: Discharge disposition: 01-Home or Self Care       Discharge Instructions    Diet - low sodium heart healthy   Complete by:  As directed    Increase activity slowly   Complete by:  As directed      Allergies as of 06/18/2018   No Known Allergies     Medication List     TAKE these medications   cetirizine 10 MG tablet Commonly known as:  ZYRTEC Take 10 mg by mouth at bedtime.   colestipol 1 g tablet Commonly known as:  COLESTID Take 1 tablet (1 g total) by mouth 2 (two) times daily.   FLUoxetine 20 MG capsule Commonly known as:  PROZAC Take 20 mg by mouth at bedtime.   folic acid 295 MCG tablet Commonly known as:  FOLVITE Take 800 mcg by mouth at bedtime.   prenatal multivitamin Tabs tablet Take 1 tablet by mouth at bedtime.   sertraline 50 MG tablet Commonly known as:  ZOLOFT Take 50 mg by mouth at bedtime.      Follow-up [place], MD Follow up in 2 week(s).   Specialty:  Gastroenterology Contact information: [place] [id number] [phone]          This was a more than 30 minute discharge encounter, most of the time orienting patient about plan and coordinating plan of care.

## 2018-06-18 NOTE — Progress Notes (Signed)
Fecal calprotectin and osmolality stool sample could not be collected as the patient did not have any further bowel movements other than arriving to the unit.

## 2018-06-18 NOTE — ED Notes (Signed)
RN aware of ED handoff and pt to be taken to floor in 15 mins.

## 2018-06-18 NOTE — ED Notes (Signed)
ED TO INPATIENT HANDOFF REPORT  ED Nurse Name and Phone #: Artist Pais, [name] 33 y.o. female Room/Bed: ED25A/ED25A  Code Status   Code Status: Full Code  Home/SNF/Other Home Patient oriented to: self, place, time and situation Is this baseline? Yes   Triage Complete: Triage complete  Chief Complaint emesis  Triage Note Patient ambulatory to triage with steady gait, without difficulty or distress noted; pt reports diarrhea since August, worse last few wks, now with N/V; denies any abd pain   Allergies No Known Allergies  Level of Care/Admitting Diagnosis ED Disposition    ED Disposition Condition [place]: [place] [100120]  Level of Care: Med-Surg [16]  Diagnosis: Abdominal pain [272536]  Admitting Physician: Herbert Pun [[id number]]  Attending Physician: Herbert Pun [[id number]]  PT Class (Do Not Modify): Observation [104]  PT Acc Code (Do Not Modify): Observation [10022]       B Medical/Surgery History History reviewed. No pertinent past medical history. Past Surgical History:  Procedure Laterality Date  . BREAST SURGERY    . WISDOM TOOTH EXTRACTION Bilateral      A IV Location/Drains/Wounds Patient Lines/Drains/Airways Status   Active Line/Drains/Airways    Name:   Placement date:   Placement time:   Site:   Days:   Peripheral IV 06/17/18 Left Antecubital   06/17/18    2357    Antecubital   1          Intake/Output Last 24 hours  Intake/Output Summary (Last 24 hours) at 06/18/2018 0617 Last data filed at 06/18/2018 0151 Gross per 24 hour  Intake 1000 ml  Output -  Net 1000 ml    Labs/Imaging Results for orders placed or performed during the hospital encounter of 06/17/18 (from the past 48 hour(s))  CBC with Differential     Status: Abnormal   Collection Time: 06/17/18 11:09 PM  Result Value Ref Range   WBC 7.1 4.0 - 10.5 K/uL   RBC 4.25 3.87 -  5.11 MIL/uL   Hemoglobin 13.2 12.0 - 15.0 g/dL   HCT 38.0 36.0 - 46.0 %   MCV 89.4 80.0 - 100.0 fL   MCH 31.1 26.0 - 34.0 pg   MCHC 34.7 30.0 - 36.0 g/dL   RDW 11.6 11.5 - 15.5 %   Platelets 299 150 - 400 K/uL   nRBC 0.0 0.0 - 0.2 %   Neutrophils Relative % 32 %   Neutro Abs 2.3 1.7 - 7.7 K/uL   Lymphocytes Relative 59 %   Lymphs Abs 4.1 (H) 0.7 - 4.0 K/uL   Monocytes Relative 8 %   Monocytes Absolute 0.6 0.1 - 1.0 K/uL   Eosinophils Relative 1 %   Eosinophils Absolute 0.1 0.0 - 0.5 K/uL   Basophils Relative 0 %   Basophils Absolute 0.0 0.0 - 0.1 K/uL   Immature Granulocytes 0 %   Abs Immature Granulocytes 0.01 0.00 - 0.07 K/uL    Comment: Performed at [facility], [address] [place], [place] [address]  Comprehensive metabolic panel     Status: Abnormal   Collection Time: 06/17/18 11:09 PM  Result Value Ref Range   Sodium 140 135 - 145 mmol/L   Potassium 4.4 3.5 - 5.1 mmol/L   Chloride 106 98 - 111 mmol/L   CO2 25 22 - 32 mmol/L   Glucose, Bld 102 (H) 70 - 99 mg/dL   BUN 13 6 - 20 mg/dL  Creatinine, Ser 0.66 0.44 - 1.00 mg/dL   Calcium 9.3 8.9 - 10.3 mg/dL   Total Protein 7.8 6.5 - 8.1 g/dL   Albumin 4.3 3.5 - 5.0 g/dL   AST 28 15 - 41 U/L   ALT 38 0 - 44 U/L   Alkaline Phosphatase 50 38 - 126 U/L   Total Bilirubin 0.5 0.3 - 1.2 mg/dL   GFR calc non Af Amer >60 >60 mL/min   GFR calc Af Amer >60 >60 mL/min   Anion gap 9 5 - 15    Comment: Performed at [facility], [address] [place], [place] [address]  Lipase, blood     Status: None   Collection Time: 06/17/18 11:09 PM  Result Value Ref Range   Lipase 22 11 - 51 U/L    Comment: Performed at [facility], [address] [place], [place] [address]  Urinalysis, Complete w Microscopic     Status: Abnormal   Collection Time: 06/17/18 11:10 PM  Result Value Ref Range   Color, Urine YELLOW (A) YELLOW   APPearance CLEAR (A) CLEAR   Specific Gravity, Urine 1.017 1.005 - 1.030    pH 5.0 5.0 - 8.0   Glucose, UA NEGATIVE NEGATIVE mg/dL   Hgb urine dipstick NEGATIVE NEGATIVE   Bilirubin Urine NEGATIVE NEGATIVE   Ketones, ur NEGATIVE NEGATIVE mg/dL   Protein, ur NEGATIVE NEGATIVE mg/dL   Nitrite NEGATIVE NEGATIVE   Leukocytes,Ua NEGATIVE NEGATIVE   RBC / HPF 0-5 0 - 5 RBC/hpf   WBC, UA NONE SEEN 0 - 5 WBC/hpf   Bacteria, UA NONE SEEN NONE SEEN   Squamous Epithelial / LPF 0-5 0 - 5   Mucus PRESENT    Uric Acid Crys, UA PRESENT     Comment: Performed at [facility], [address] [place], [place] [address]  Pregnancy, urine POC     Status: None   Collection Time: 06/17/18 11:14 PM  Result Value Ref Range   Preg Test, Ur NEGATIVE NEGATIVE    Comment:        THE SENSITIVITY OF THIS METHODOLOGY IS >24 mIU/mL   C difficile quick scan w PCR reflex     Status: None   Collection Time: 06/18/18  1:50 AM  Result Value Ref Range   C Diff antigen NEGATIVE NEGATIVE   C Diff toxin NEGATIVE NEGATIVE   C Diff interpretation No C. difficile detected.     Comment: Performed at [facility], [address] [place], [place] [address]  Gastrointestinal Panel by PCR , Stool     Status: None   Collection Time: 06/18/18  1:50 AM  Result Value Ref Range   Campylobacter species NOT DETECTED NOT DETECTED   Plesimonas shigelloides NOT DETECTED NOT DETECTED   Salmonella species NOT DETECTED NOT DETECTED   Yersinia enterocolitica NOT DETECTED NOT DETECTED   Vibrio species NOT DETECTED NOT DETECTED   Vibrio cholerae NOT DETECTED NOT DETECTED   Enteroaggregative E coli (EAEC) NOT DETECTED NOT DETECTED   Enteropathogenic E coli (EPEC) NOT DETECTED NOT DETECTED   Enterotoxigenic E coli (ETEC) NOT DETECTED NOT DETECTED   Shiga like toxin producing E coli (STEC) NOT DETECTED NOT DETECTED   Shigella/Enteroinvasive E coli (EIEC) NOT DETECTED NOT DETECTED   Cryptosporidium NOT DETECTED NOT DETECTED   Cyclospora cayetanensis NOT DETECTED NOT DETECTED   Entamoeba  histolytica NOT DETECTED NOT DETECTED   Giardia lamblia NOT DETECTED NOT DETECTED   Adenovirus F40/41 NOT DETECTED NOT  DETECTED   Astrovirus NOT DETECTED NOT DETECTED   Norovirus GI/GII NOT DETECTED NOT DETECTED   Rotavirus A NOT DETECTED NOT DETECTED   Sapovirus (I, II, IV, and V) NOT DETECTED NOT DETECTED    Comment: Performed at [facility], [address] [place], [place] [address]   Ct Abdomen Pelvis W Contrast  Result Date: 06/18/2018 CLINICAL DATA:  diarrhea since August worsening over the last few weeks. Now with nausea and vomiting. EXAM: CT ABDOMEN AND PELVIS WITH CONTRAST TECHNIQUE: Multidetector CT imaging of the abdomen and pelvis was performed using the standard protocol following bolus administration of intravenous contrast. CONTRAST:  147mL OMNIPAQUE IOHEXOL 300 MG/ML  SOLN COMPARISON:  None. FINDINGS: Lower chest: No acute abnormality. Hepatobiliary: No focal liver abnormality is seen. No gallstones, gallbladder wall thickening, or biliary dilatation. Pancreas: Unremarkable. No pancreatic ductal dilatation or surrounding inflammatory changes. Spleen: Normal in size without focal abnormality. Adrenals/Urinary Tract: Adrenal glands are unremarkable. Kidneys are normal, without renal calculi, focal lesion, or hydronephrosis. Bladder is unremarkable. Stomach/Bowel: Stomach normal. The small bowel loops are normal in caliber without obstruction. The appendix is visualized within the right lower quadrant of the abdomen and iliac fossa. The appendix is abnormally distended with high attenuation material measuring 1.2 cm. No significant appendiceal wall thickening or surrounding inflammation or free fluid. Normal appearance of the colon Vascular/Lymphatic: Normal appearance of the abdominal aorta. No enlarged retroperitoneal or mesenteric adenopathy. No enlarged pelvic or inguinal lymph nodes. Reproductive: Uterus and bilateral adnexa are unremarkable. Other: No abdominal wall hernia  or abnormality. No abdominopelvic ascites. Musculoskeletal: No acute or significant osseous findings. IMPRESSION: 1. The appendix appears abnormally distended containing high attenuation material measuring 1.2 cm in diameter (normal 6 mm or less). No surrounding fat stranding or free fluid however. Correlate for any clinical signs or symptoms of acute appendicitis. 2. Examination is otherwise unremarkable. No findings to suggest colitis. Electronically Signed   By: Kerby Moors M.D.   On: 06/18/2018 00:26    Pending Labs Unresulted Labs (From admission, onward)    Start     Ordered   06/18/18 0557  Chromogranin A  Once,   STAT     06/18/18 0601   06/18/18 0552  HIV antibody (Routine Testing)  Once,   STAT     06/18/18 0601   06/17/18 2343  OVA + PARASITE EXAM  ONCE - STAT,   STAT     06/17/18 2342          Vitals/Pain Today's Vitals   06/18/18 0146 06/18/18 0146 06/18/18 0351 06/18/18 0352  BP:  (!) 110/58  96/61  Pulse:  78  80  Resp:  18  18  Temp:      TempSrc:      SpO2:  100%  98%  Weight:      Height:      PainSc: 0-No pain 0-No pain 0-No pain 0-No pain    Isolation Precautions No active isolations  Medications Medications  FLUoxetine (PROZAC) capsule 20 mg (has no administration in time range)  folic acid (FOLVITE) tablet 0.5 mg (has no administration in time range)  0.9 %  sodium chloride infusion (has no administration in time range)  acetaminophen (TYLENOL) tablet 650 mg (has no administration in time range)    Or  acetaminophen (TYLENOL) suppository 650 mg (has no administration in time range)  ondansetron (ZOFRAN-ODT) disintegrating tablet 4 mg (has no administration in time range)    Or  ondansetron (ZOFRAN) injection 4  mg (has no administration in time range)  sodium chloride 0.9 % bolus 1,000 mL (0 mLs Intravenous Stopped 06/18/18 0151)  iohexol (OMNIPAQUE) 300 MG/ML solution 100 mL (100 mLs Intravenous Contrast Given 06/18/18 0012)     Mobility walks Low fall risk   Focused Assessments    R Recommendations: See Admitting Provider Note  Report given to:   Additional Notes:

## 2018-06-19 LAB — HIV ANTIBODY (ROUTINE TESTING W REFLEX): HIV Screen 4th Generation wRfx: NONREACTIVE

## 2018-06-19 LAB — CHROMOGRANIN A: Chromogranin A (ng/mL): 53.3 ng/mL (ref 0.0–101.8)

## 2018-06-20 LAB — CALPROTECTIN, FECAL: Calprotectin, Fecal: 94 ug/g (ref 0–120)

## 2018-06-20 LAB — CELIAC DISEASE PANEL
Endomysial Ab, IgA: NEGATIVE
IgA: 138 mg/dL (ref 87–352)
Tissue Transglutaminase Ab, IgA: <2 U/mL (ref 0–3)

## 2018-06-23 ENCOUNTER — Other Ambulatory Visit
Admission: RE | Admit: 2018-06-23 | Discharge: 2018-06-23 | Disposition: A | Discharge status: Home / Self Care | Payer: BLUE CROSS/BLUE SHIELD | Source: Ambulatory Visit | Attending: Gastroenterology | Admitting: Gastroenterology

## 2018-06-23 DIAGNOSIS — K529 Noninfective gastroenteritis and colitis, unspecified: Secondary | ICD-10-CM | POA: Insufficient documentation

## 2018-06-23 DIAGNOSIS — R1032 Left lower quadrant pain: Secondary | ICD-10-CM | POA: Diagnosis present

## 2018-06-23 DIAGNOSIS — R197 Diarrhea, unspecified: Secondary | ICD-10-CM | POA: Diagnosis present

## 2018-06-23 DIAGNOSIS — R1031 Right lower quadrant pain: Secondary | ICD-10-CM | POA: Insufficient documentation

## 2018-06-26 LAB — O&P RESULT

## 2018-06-26 LAB — GIARDIA, EIA; OVA/PARASITE: Giardia Ag, Stl: NEGATIVE

## 2020-03-31 ENCOUNTER — Ambulatory Visit (INDEPENDENT_AMBULATORY_CARE_PROVIDER_SITE_OTHER): Payer: BC Managed Care – PPO | Admitting: Dermatology

## 2020-03-31 ENCOUNTER — Other Ambulatory Visit: Payer: Self-pay

## 2020-03-31 DIAGNOSIS — D225 Melanocytic nevi of trunk: Secondary | ICD-10-CM

## 2020-03-31 DIAGNOSIS — D485 Neoplasm of uncertain behavior of skin: Secondary | ICD-10-CM

## 2020-03-31 DIAGNOSIS — L719 Rosacea, unspecified: Secondary | ICD-10-CM | POA: Diagnosis not present

## 2020-03-31 DIAGNOSIS — L814 Other melanin hyperpigmentation: Secondary | ICD-10-CM | POA: Diagnosis not present

## 2020-03-31 DIAGNOSIS — D1800 Hemangioma unspecified site: Secondary | ICD-10-CM

## 2020-03-31 DIAGNOSIS — L219 Seborrheic dermatitis, unspecified: Secondary | ICD-10-CM | POA: Diagnosis not present

## 2020-03-31 DIAGNOSIS — L821 Other seborrheic keratosis: Secondary | ICD-10-CM

## 2020-03-31 DIAGNOSIS — Z1283 Encounter for screening for malignant neoplasm of skin: Secondary | ICD-10-CM

## 2020-03-31 DIAGNOSIS — Z808 Family history of malignant neoplasm of other organs or systems: Secondary | ICD-10-CM

## 2020-03-31 DIAGNOSIS — D229 Melanocytic nevi, unspecified: Secondary | ICD-10-CM

## 2020-03-31 DIAGNOSIS — L578 Other skin changes due to chronic exposure to nonionizing radiation: Secondary | ICD-10-CM

## 2020-03-31 DIAGNOSIS — D239 Other benign neoplasm of skin, unspecified: Secondary | ICD-10-CM

## 2020-03-31 HISTORY — DX: Other benign neoplasm of skin, unspecified: D23.9

## 2020-03-31 MED ORDER — KETOCONAZOLE 2 % EX SHAM: MEDICATED_SHAMPOO | CUTANEOUS | 5 refills | Status: DC

## 2020-03-31 MED ORDER — TRIAMCINOLONE ACETONIDE 0.1 % EX LOTN: TOPICAL_LOTION | CUTANEOUS | 2 refills | Status: DC

## 2020-03-31 NOTE — Progress Notes (Signed)
New Patient Visit  Subjective  Kelly [name] is a 35 y.o. female who presents for the following: TBSE (Patient here for full body skin exam and skin cancer screening. No personal history of skin cancer, patient's father has a history of SCC).  Patient concerned about 2 darker spots under breasts.  She also reports itch and scale at scalp.    The following portions of the chart were reviewed this encounter and updated as appropriate:   Tobacco  Allergies  Meds  Problems  Med Hx  Surg Hx  Fam Hx      Review of Systems:  No other skin or systemic complaints except as noted in HPI or Assessment and Plan.  Objective  Well appearing patient in no apparent distress; mood and affect are within normal limits.  A full examination was performed including scalp, head, eyes, ears, nose, lips, neck, chest, axillae, abdomen, back, buttocks, bilateral upper extremities, bilateral lower extremities, hands, feet, fingers, toes, fingernails, and toenails. All findings within normal limits unless otherwise noted below.  Objective  Left inferior breast: 0.4cm irregular erythematous medium brown papule  Objective  Scalp: Scale and erythema  Objective  Head - Anterior (Face): Mid face erythema and telangiectasias   Assessment & Plan  Neoplasm of uncertain behavior of skin Left inferior breast  Epidermal / dermal shaving  Lesion diameter (cm):  0.4 Informed consent: discussed and consent obtained   Timeout: patient name, date of birth, surgical site, and procedure verified   Patient was prepped and draped in usual sterile fashion: area prepped with isopropyl alcohol. Anesthesia: the lesion was anesthetized in a standard fashion   Anesthetic:  1% lidocaine w/ epinephrine 1-100,000 buffered w/ 8.4% NaHCO3 Instrument used: flexible razor blade   Hemostasis achieved with: aluminum chloride   Outcome: patient tolerated procedure well   Post-procedure details: wound care  instructions given   Additional details:  Mupirocin and a bandage applied  Specimen 1 - Surgical pathology Differential Diagnosis: r/o Atypia  Check Margins: No 0.4cm irregular erythematous medium brown papule  Other Related Medications doxycycline (MONODOX) 100 MG capsule mupirocin ointment (BACTROBAN) 2 %  Seborrheic dermatitis Scalp  Chronic condition (no cure, only control) with expected duration over one year. Condition is bothersome to patient. Currently flared.  Start ketoconazole 2% shampoo apply three times per week, massage into scalp and leave in for 10 minutes before rinsing out  Start TMC 0.1% lotion to affected areas at scalp 1-2 times daily as needed for itch. Avoid applying to face, groin, and axilla. Use as directed. Risk of skin atrophy with long-term use reviewed.   Topical steroids (such as triamcinolone, fluocinolone, fluocinonide, mometasone, clobetasol, halobetasol, betamethasone, hydrocortisone) can cause thinning and lightening of the skin if they are used for too long in the same area. Your physician has selected the right strength medicine for your problem and area affected on the body. Please use your medication only as directed by your physician to prevent side effects.     Ordered Medications: ketoconazole (NIZORAL) 2 % shampoo triamcinolone lotion (KENALOG) 0.1 %  Rosacea Head - Anterior (Face)  Erythematotelangiectatic type No ocular symptoms  May continue BBL treatments.    Lentigines - Scattered tan macules - Discussed due to sun exposure - Benign, observe - Call for any changes  Seborrheic Keratoses - Stuck-on, waxy, tan-brown papules and plaques  - Discussed benign etiology and prognosis. - Observe - Call for any changes  Melanocytic Nevi - Tan-brown and/or pink-flesh-colored symmetric macules and papules -  Benign appearing on exam today - Observation - Call clinic for new or changing moles - Recommend daily use of broad  spectrum spf 30+ sunscreen to sun-exposed areas.   Hemangiomas - Red papules - Discussed benign nature - Observe - Call for any changes  Actinic Damage - Chronic, secondary to cumulative UV/sun exposure - diffuse scaly erythematous macules with underlying dyspigmentation - Recommend daily broad spectrum sunscreen SPF 30+ to sun-exposed areas, reapply every 2 hours as needed.  - Call for new or changing lesions.  Skin cancer screening performed today.   No follow-ups on file.  Graciella Belton, RMA, am acting as scribe for Forest Gleason, MD .  Documentation: I have reviewed the above documentation for accuracy and completeness, and I agree with the above.  Forest Gleason, MD

## 2020-03-31 NOTE — Patient Instructions (Addendum)
Melanoma ABCDEs  Melanoma is the most dangerous type of skin cancer, and is the leading cause of death from skin disease.  You are more likely to develop melanoma if you:  Have light-colored skin, light-colored eyes, or red or blond hair  Spend a lot of time in the sun  Tan regularly, either outdoors or in a tanning bed  Have had blistering sunburns, especially during childhood  Have a close family member who has had a melanoma  Have atypical moles or large birthmarks  Early detection of melanoma is key since treatment is typically straightforward and cure rates are extremely high if we catch it early.   The first sign of melanoma is often a change in a mole or a new dark spot.  The ABCDE system is a way of remembering the signs of melanoma.  A for asymmetry:  The two halves do not match. B for border:  The edges of the growth are irregular. C for color:  A mixture of colors are present instead of an even brown color. D for diameter:  Melanomas are usually (but not always) greater than 39mm - the size of a pencil eraser. E for evolution:  The spot keeps changing in size, shape, and color.  Please check your skin once per month between visits. You can use a small mirror in front and a large mirror behind you to keep an eye on the back side or your body.   If you see any new or changing lesions before your next follow-up, please call to schedule a visit.  Please continue daily skin protection including broad spectrum sunscreen SPF 30+ to sun-exposed areas, reapplying every 2 hours as needed when you're outdoors.    Wound Care Instructions  1. Cleanse wound gently with soap and water once a day then pat dry with clean gauze. Apply a thing coat of Petrolatum (petroleum jelly, "Vaseline") over the wound (unless you have an allergy to this). We recommend that you use a new, sterile tube of Vaseline. Do not pick or remove scabs. Do not remove the yellow or white "healing tissue" from the  base of the wound.  2. Cover the wound with fresh, clean, nonstick gauze and secure with paper tape. You may use Band-Aids in place of gauze and tape if the would is small enough, but would recommend trimming much of the tape off as there is often too much. Sometimes Band-Aids can irritate the skin.  3. You should call the office for your biopsy report after 1 week if you have not already been contacted.  4. If you experience any problems, such as abnormal amounts of bleeding, swelling, significant bruising, significant pain, or evidence of infection, please call the office immediately.   Recommend taking Heliocare sun protection supplement daily in sunny weather for additional sun protection. For maximum protection on the sunniest days, you can take up to 2 capsules of regular Heliocare OR take 1 capsule of Heliocare Ultra. For prolonged exposure (such as a full day in the sun), you can repeat your dose of the supplement 4 hours after your first dose. Heliocare can be purchased at [facility] or at [email or web address].    Topical steroids (such as triamcinolone, fluocinolone, fluocinonide, mometasone, clobetasol, halobetasol, betamethasone, hydrocortisone) can cause thinning and lightening of the skin if they are used for too long in the same area. Your physician has selected the right strength medicine for your problem and area affected on the body. Please use your medication  only as directed by your physician to prevent side effects.   May try at home treatment for redness at the face. Mix a bottle of original Afrin with a bottle of CeraVe PM. Shake well and apply to entire face in the morning.

## 2020-04-06 ENCOUNTER — Telehealth: Payer: Self-pay

## 2020-04-06 NOTE — Telephone Encounter (Signed)
Patient advised of biopsy results. She has been scheduled in March for what is best with her work schedule.

## 2020-04-06 NOTE — Progress Notes (Signed)
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 On the spectrum from normal mole to melanoma skin cancer, this is in between the two but closer towards a melanoma skin cancer.  - The treatment of choice for severely atypical moles is to cut them out in clinic with an area of normal looking skin around them to get all the atypical cells out. The skin that is removed will be sent to check under the microscope again to be sure it looks completely out.   - People who have a history of atypical moles do have a slightly increased risk of developing melanoma somewhere on the body, so a full body skin exam by a dermatologist is recommended at least once a year. - Monthly self skin checks and daily sun protection are also recommended.  - Please also call if you notice any new or changing spots anywhere else on the body before your follow-up visit.   Dr.  Laurence Ferrari called 04/06/2020 at 1:40 PM.  No answer, left voicemail.   Starting 04/07/2020, MAs please call and schedule for excision. Thank you!

## 2020-04-06 NOTE — Telephone Encounter (Signed)
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 On the spectrum from normal mole to melanoma skin cancer, this is in between the two but closer towards a melanoma skin cancer.  - The treatment of choice for severely atypical moles is to cut them out in clinic with an area of normal looking skin around them to get all the atypical cells out. The skin that is removed will be sent to check under the microscope again to be sure it looks completely out.   - People who have a history of atypical moles do have a slightly increased risk of developing melanoma somewhere on the body, so a full body skin exam by a dermatologist is recommended at least once a year. - Monthly self skin checks and daily sun protection are also recommended.  - Please also call if you notice any new or changing spots anywhere else on the body before your follow-up visit.   Dr.  Laurence Ferrari called 04/06/2020 at 1:40 PM.  No answer, left voicemail.   Starting 04/07/2020, MAs please call and schedule for excision. Thank you!

## 2020-04-11 ENCOUNTER — Telehealth: Payer: Self-pay

## 2020-04-11 ENCOUNTER — Encounter: Payer: Self-pay | Admitting: Dermatology

## 2020-04-11 NOTE — Telephone Encounter (Signed)
Patient called in regarding biopsy done on breast last week. She states the area is red, sore to touch and hard around the edges. She also states the medial of biopsy is a Delayza Lungren/yellow color. She is using Bacitracin but unsure if she should be doing anything different. Patient's main concern is the hardness she is experiencing around biopsy site.  Sending to all three providers since Dr. Laurence Ferrari is out of the office today.

## 2020-04-11 NOTE — Telephone Encounter (Signed)
Patient is unable to get to office in the AM as she works for a [employer] office in [place] and unable to get off of work. Patient has an active MyChart. She is going to upload photos tonight for Dr. Laurence Ferrari to review.

## 2020-04-11 NOTE — Telephone Encounter (Signed)
Sounds like she needs seen today by Dr Chauncey Cruel or I if Dr Laurence Ferrari not in this week.

## 2020-04-11 NOTE — Telephone Encounter (Signed)
She may have an infection developing.  She could send in a picture to see if it could wait until tomorrow morning when Dr. Laurence Ferrari is here?  Or I am also able to see her this afternoon if she prefers.

## 2020-04-12 ENCOUNTER — Other Ambulatory Visit: Payer: Self-pay

## 2020-04-12 DIAGNOSIS — D485 Neoplasm of uncertain behavior of skin: Secondary | ICD-10-CM

## 2020-04-12 MED ORDER — DOXYCYCLINE MONOHYDRATE 100 MG PO CAPS: 100.0000 mg | ORAL_CAPSULE | Freq: Two times a day (BID) | ORAL | 0 refills | Status: AC

## 2020-04-12 MED ORDER — MUPIROCIN 2 % EX OINT: 1.0000 "application " | TOPICAL_OINTMENT | Freq: Three times a day (TID) | CUTANEOUS | 0 refills | Status: DC

## 2020-04-12 NOTE — Telephone Encounter (Signed)
If it is difficult for patient to come then, please start mupirocin topical 3 times a day and doxycycline 100 mg twice a day with food.   Doxycycline should be taken with food to prevent nausea. Do not lay down for 30 minutes after taking. Be cautious with sun exposure and use good sun protection while on this medication. Pregnant women should not take this medication.   If she has worsening despite this then she should come in to be seen. Thank you!

## 2020-04-14 ENCOUNTER — Encounter: Payer: Self-pay | Admitting: Dermatology

## 2020-05-31 ENCOUNTER — Ambulatory Visit (INDEPENDENT_AMBULATORY_CARE_PROVIDER_SITE_OTHER): Payer: BC Managed Care – PPO | Admitting: Dermatology

## 2020-05-31 ENCOUNTER — Other Ambulatory Visit: Payer: Self-pay

## 2020-05-31 ENCOUNTER — Encounter: Payer: Self-pay | Admitting: Dermatology

## 2020-05-31 DIAGNOSIS — D225 Melanocytic nevi of trunk: Secondary | ICD-10-CM | POA: Diagnosis not present

## 2020-05-31 DIAGNOSIS — D485 Neoplasm of uncertain behavior of skin: Secondary | ICD-10-CM

## 2020-05-31 NOTE — Progress Notes (Signed)
   Follow-Up Visit   Subjective  Kelly [name] is a 35 y.o. female who presents for the following: Procedure (Patient here today for excision of bx proven severe dysplastic nevus at left inferior breast.).  The following portions of the chart were reviewed this encounter and updated as appropriate:   Tobacco  Allergies  Meds  Problems  Med Hx  Surg Hx  Fam Hx      Review of Systems:  No other skin or systemic complaints except as noted in HPI or Assessment and Plan.  Objective  Well appearing patient in no apparent distress; mood and affect are within normal limits.  A focused examination was performed including left breast. Relevant physical exam findings are noted in the Assessment and Plan.  Objective  Left Inferior Breast: Healing biopsy site [id number]   Assessment & Plan  Neoplasm of uncertain behavior of skin Left Inferior Breast  Skin excision  Lesion length (cm):  0.9 Lesion width (cm):  0.9 Margin per side (cm):  0.5 Total excision diameter (cm):  1.9 Informed consent: discussed and consent obtained   Timeout: patient name, date of birth, surgical site, and procedure verified   Procedure prep:  Patient was prepped and draped in usual sterile fashion Prep type:  Chlorhexidine Anesthesia: the lesion was anesthetized in a standard fashion   Anesthetic:  1% lidocaine w/ epinephrine 1-100,000 buffered w/ 8.4% NaHCO3 Instrument used comment:  15c Hemostasis achieved with: suture, pressure and electrodesiccation   Outcome: patient tolerated procedure well with no complications   Post-procedure details: wound care instructions given   Additional details:  Mupirocin and a pressure dressing applied  Skin repair Complexity:  Intermediate Final length (cm):  6.6 Informed consent: discussed and consent obtained   Timeout: patient name, date of birth, surgical site, and procedure verified   Procedure prep:  Patient was prepped and draped in usual sterile  fashion Prep type:  Chlorhexidine Anesthesia: the lesion was anesthetized in a standard fashion   Anesthetic:  1% lidocaine w/ epinephrine 1-100,000 local infiltration Reason for type of repair: reduce tension to allow closure, reduce the risk of dehiscence, infection, and necrosis, reduce subcutaneous dead space and avoid a hematoma, allow closure of the large defect, allow side-to-side closure without requiring a flap or graft and enhance both functionality and cosmetic results   Undermining: area extensively undermined   Subcutaneous layers (deep stitches):  Suture size:  4-0 Suture type: Vicryl (polyglactin 910) and nylon   Stitches:  Buried vertical mattress Fine/surface layer approximation (top stitches):  Suture size:  3-0 Suture type: Vicryl (polyglactin 910)   Suture removal (days):  7 Hemostasis achieved with: pressure and electrodesiccation Outcome: patient tolerated procedure well with no complications   Post-procedure details: wound care instructions given   Additional details:  Mupirocin and a pressure bandage applied   Specimen 1 - Surgical pathology Differential Diagnosis: Biopsy proven dysplastic nevus with severe atypia  Check Margins: No Healing biopsy site [id number] Tagged at lateral border  Other Related Medications mupirocin ointment (BACTROBAN) 2 %  Return in about 1 week (around 06/07/2020) for Suture Removal.  Graciella Belton, RMA, am acting as scribe for Forest Gleason, MD .  Documentation: I have reviewed the above documentation for accuracy and completeness, and I agree with the above.  Forest Gleason, MD

## 2020-05-31 NOTE — Patient Instructions (Signed)
Wound Care Instructions  1. Cleanse wound gently with soap and water once a day then pat dry with clean gauze. Apply a thing coat of Petrolatum (petroleum jelly, "Vaseline") over the wound (unless you have an allergy to this). We recommend that you use a new, sterile tube of Vaseline. Do not pick or remove scabs. Do not remove the yellow or white "healing tissue" from the base of the wound.  2. Cover the wound with fresh, clean, nonstick gauze and secure with paper tape. You may use Band-Aids in place of gauze and tape if the would is small enough, but would recommend trimming much of the tape off as there is often too much. Sometimes Band-Aids can irritate the skin.  3. You should call the office for your biopsy report after 1 week if you have not already been contacted.  4. If you experience any problems, such as abnormal amounts of bleeding, swelling, significant bruising, significant pain, or evidence of infection, please call the office immediately.  5. FOR ADULT SURGERY PATIENTS: If you need something for pain relief you may take 1 extra strength Tylenol (acetaminophen) AND 2 Ibuprofen (200mg  each) together every 4 hours as needed for pain. (do not take these if you are allergic to them or if you have a reason you should not take them.) Typically, you may only need pain medication for 1 to 3 days.    If you have an issue when the clinic is closed that cannot wait until the next business day, you can page your doctor at the number below.   Please note that while we do our best to be available for urgent issues outside of office hours, we are not available 24/7.  If you have a medical emergency and do not hear back from your doctor promptly, please seek medical care at your doctor's office, retail clinic, urgent care center or emergency room.  Pager Numbers  Dr. Nehemiah Massed: [phone]  Dr. Laurence Ferrari: [phone]  Dr. Nicole Kindred: [phone]

## 2020-06-01 ENCOUNTER — Telehealth: Payer: Self-pay

## 2020-06-01 NOTE — Telephone Encounter (Signed)
Called patient to check on her following yesterday's surgery. No answer and VM full, JS

## 2020-06-07 ENCOUNTER — Encounter: Payer: Self-pay | Admitting: Dermatology

## 2020-06-07 ENCOUNTER — Ambulatory Visit (INDEPENDENT_AMBULATORY_CARE_PROVIDER_SITE_OTHER): Payer: BC Managed Care – PPO | Admitting: Dermatology

## 2020-06-07 ENCOUNTER — Other Ambulatory Visit: Payer: Self-pay

## 2020-06-07 DIAGNOSIS — Z4802 Encounter for removal of sutures: Secondary | ICD-10-CM

## 2020-06-07 NOTE — Progress Notes (Signed)
   Follow-Up Visit   Subjective  Kelly [name] is a 35 y.o. female who presents for the following: Follow-up (Patient here for suture removal after excision of severe dysplastic nevus at left breast, margins clear).    The following portions of the chart were reviewed this encounter and updated as appropriate:   Tobacco  Allergies  Meds  Problems  Med Hx  Surg Hx  Fam Hx      Review of Systems:  No other skin or systemic complaints except as noted in HPI or Assessment and Plan.  Objective  Well appearing patient in no apparent distress; mood and affect are within normal limits.  A focused examination was performed including left breast. Relevant physical exam findings are noted in the Assessment and Plan.    Assessment & Plan    Encounter for Removal of Sutures - Incision site at the left inferior breast is clean, dry and intact - Wound cleansed, sutures removed, wound cleansed and steri strips applied.  - Discussed pathology results showing margins free  - Patient advised to keep steri-strips dry until they fall off. - Scars remodel for a full year. - Once steri-strips fall off, patient can apply over-the-counter silicone scar cream each night to help with scar remodeling if desired. - Patient advised to call with any concerns or if they notice any new or changing lesions.   Return in about 4 months (around 10/07/2020) for TBSE.  Graciella Belton, RMA, am acting as scribe for Forest Gleason, MD .  Documentation: I have reviewed the above documentation for accuracy and completeness, and I agree with the above.  Forest Gleason, MD

## 2020-06-07 NOTE — Patient Instructions (Signed)
Recommend Serica moisturizing scar formula cream every night or [facility] brand or Mederma silicone scar sheet every night for the first year after a scar appears to help with scar remodeling if desired. Scars remodel on their own for a full year.

## 2020-07-31 IMAGING — CT CT ABDOMEN AND PELVIS WITH CONTRAST
2 of 4 series · 16 of 46 positions shown, 18 images · IV contrast (APPLIED)
Comparison: None.

CLINICAL DATA: diarrhea since [REDACTED] worsening over the last few
weeks. Now with nausea and vomiting.

EXAM:
CT ABDOMEN AND PELVIS WITH CONTRAST
TECHNIQUE: Multidetector CT imaging of the abdomen and pelvis was performed
using the standard protocol following bolus administration of
intravenous contrast.
CONTRAST:  100mL OMNIPAQUE IOHEXOL 300 MG/ML  SOLN

[Series 2: routine abd/pel with · axial · 0.78mm/px · z∈[-914,-499]mm · 13 of 91 slices shown, 15 images]
[im 4/91  soft-tissue]
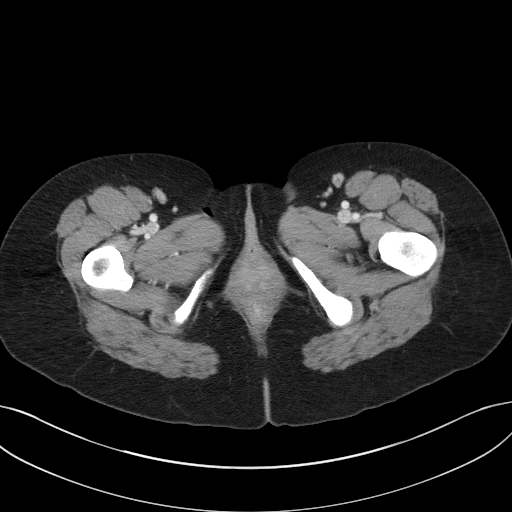
[im 4/91  bone]
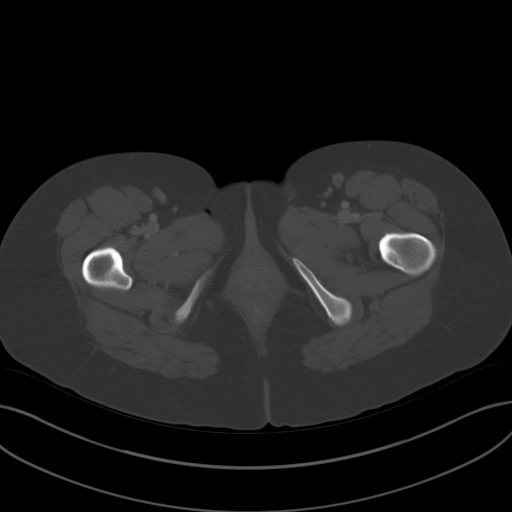
[im 11/91  soft-tissue]
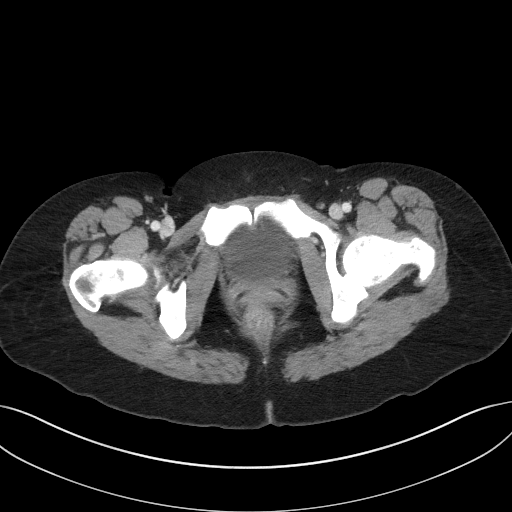
[im 19/91  soft-tissue]
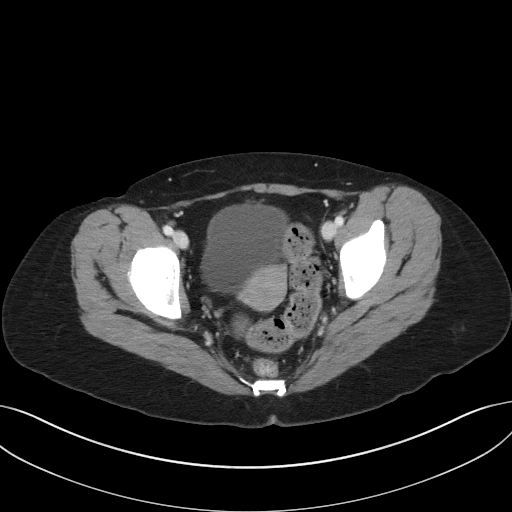
[im 26/91  soft-tissue]
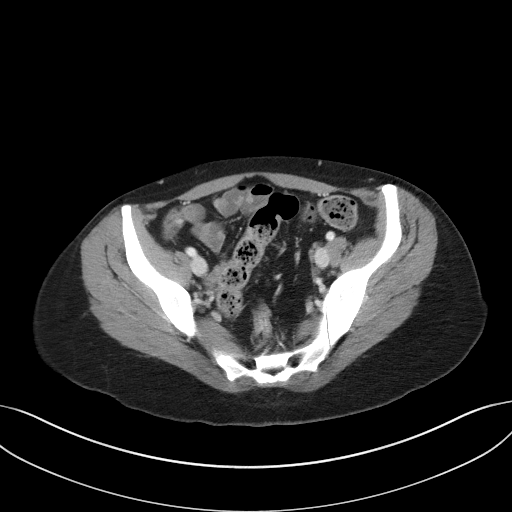
[im 33/91  soft-tissue]
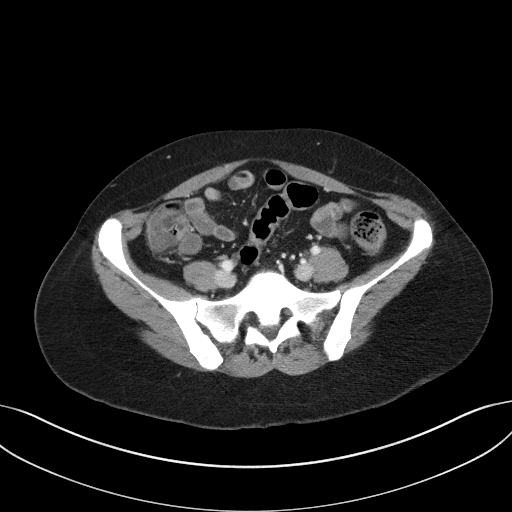
[im 40/91  soft-tissue]
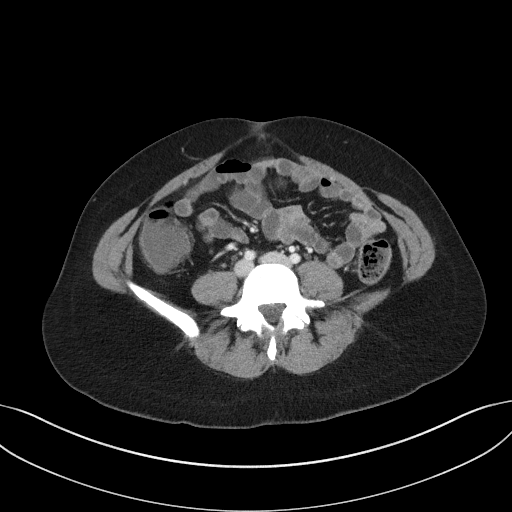
[im 47/91  soft-tissue]
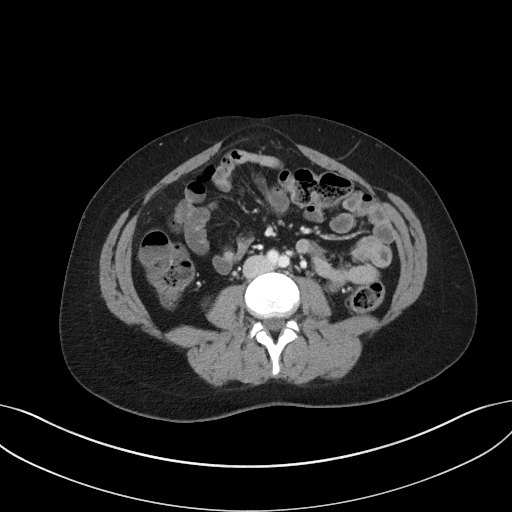
[im 51/91  soft-tissue]
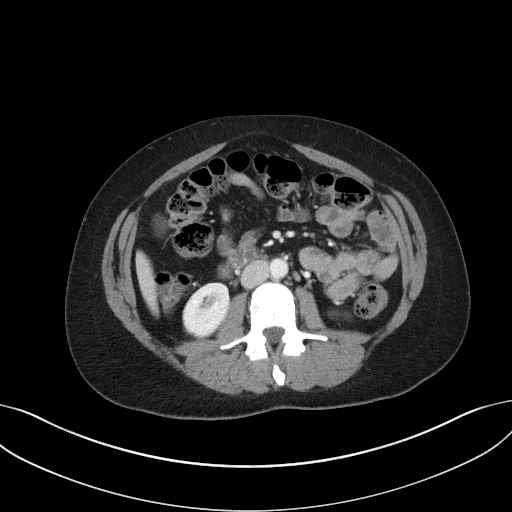
[im 58/91  soft-tissue]
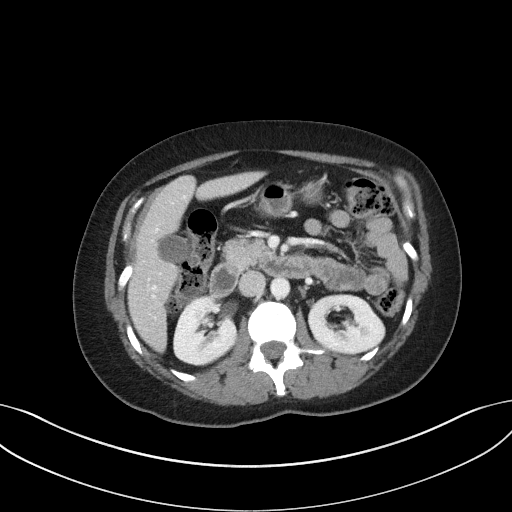
[im 58/91  bone]
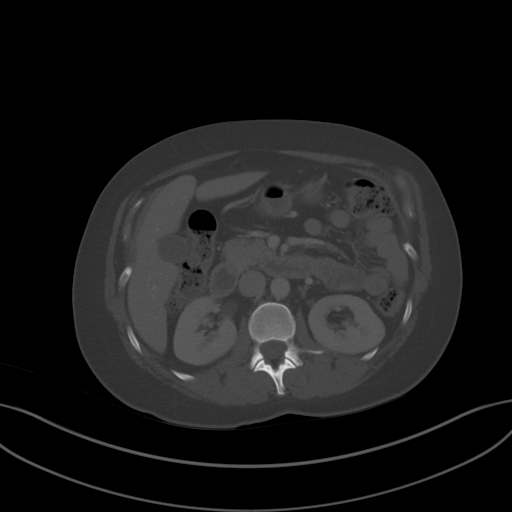
[im 65/91  soft-tissue]
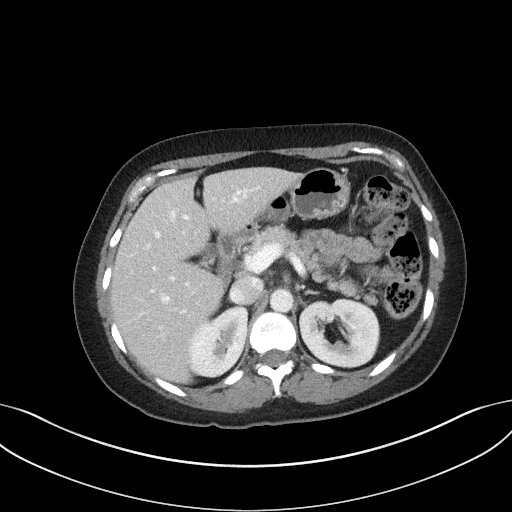
[im 73/91  soft-tissue]
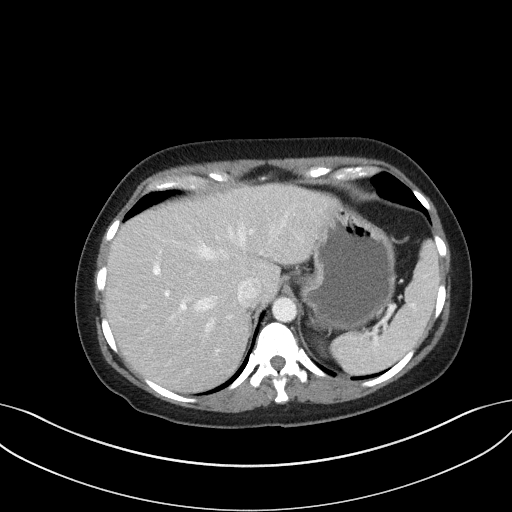
[im 80/91  soft-tissue]
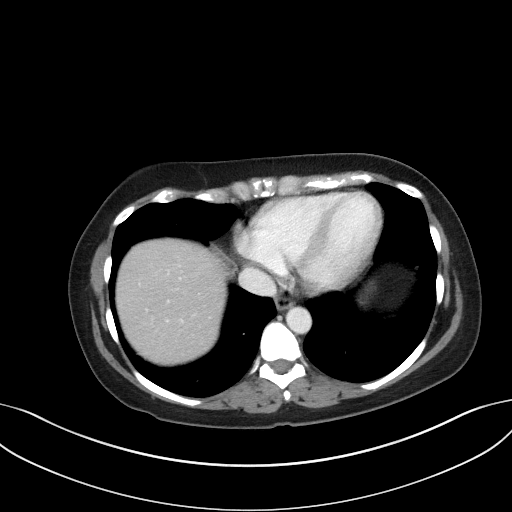
[im 87/91  soft-tissue]
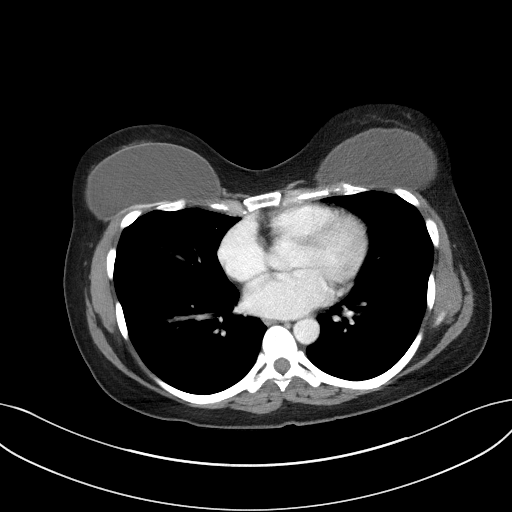

[Series 5: coronal st · coronal · 0.71mm/px · 3 of 77 slices shown]
[im 26/77  soft-tissue]
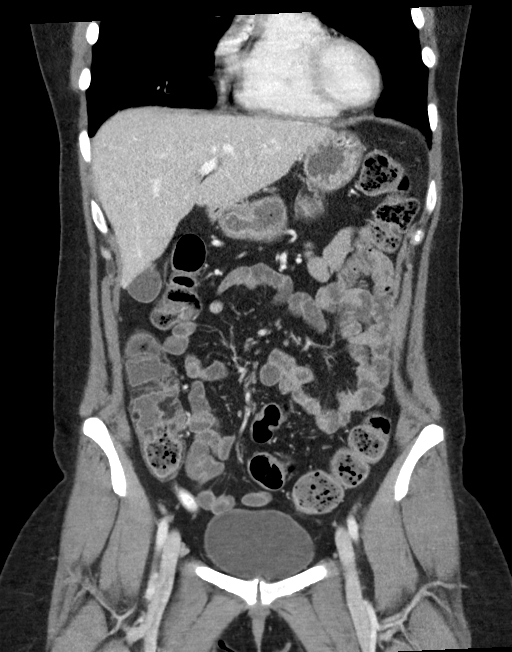
[im 34/77  soft-tissue]
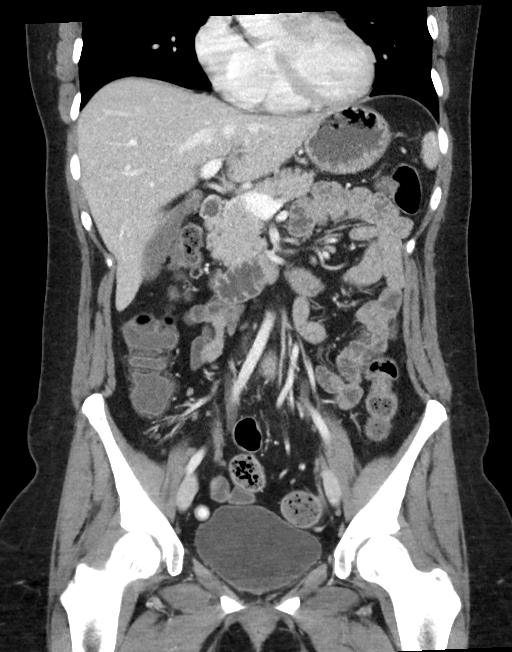
[im 43/77  soft-tissue]
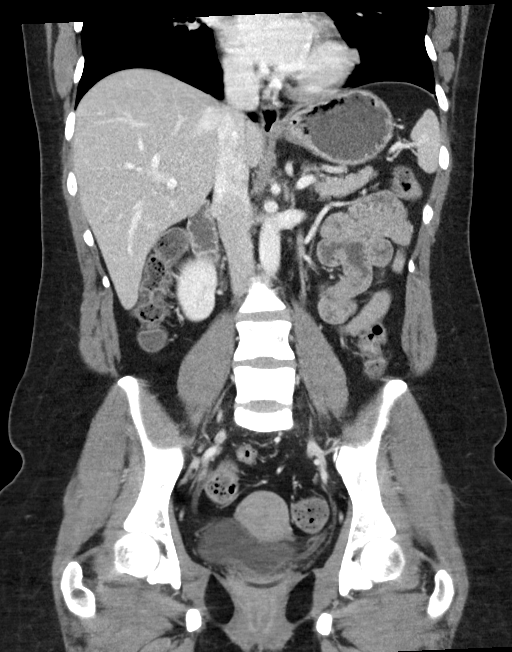

[16 of 46 positions shown; findings below may reference images not displayed]

FINDINGS: Lower chest: No acute abnormality.

Hepatobiliary: No focal liver abnormality is seen. No gallstones,
gallbladder wall thickening, or biliary dilatation.

Pancreas: Unremarkable. No pancreatic ductal dilatation or
surrounding inflammatory changes.

Spleen: Normal in size without focal abnormality.

Adrenals/Urinary Tract: Adrenal glands are unremarkable. Kidneys are
normal, without renal calculi, focal lesion, or hydronephrosis.
Bladder is unremarkable.

Stomach/Bowel: Stomach normal. The small bowel loops are normal in
caliber without obstruction. The appendix is visualized within the
right lower quadrant of the abdomen and iliac fossa. The appendix is
abnormally distended with high attenuation material measuring
cm. No significant appendiceal wall thickening or surrounding
inflammation or free fluid. Normal appearance of the colon

Vascular/Lymphatic: Normal appearance of the abdominal aorta. No
enlarged retroperitoneal or mesenteric adenopathy. No enlarged
pelvic or inguinal lymph nodes.

Reproductive: Uterus and bilateral adnexa are unremarkable.

Other: No abdominal wall hernia or abnormality. No abdominopelvic
ascites.

Musculoskeletal: No acute or significant osseous findings.
IMPRESSION: 1. The appendix appears abnormally distended containing high
attenuation material measuring 1.2 cm in diameter (normal 6 mm or
less). No surrounding fat stranding or free fluid however. Correlate
for any clinical signs or symptoms of acute appendicitis.
2. Examination is otherwise unremarkable. No findings to suggest
colitis.

## 2020-10-13 ENCOUNTER — Ambulatory Visit (INDEPENDENT_AMBULATORY_CARE_PROVIDER_SITE_OTHER): Payer: BC Managed Care – PPO | Admitting: Dermatology

## 2020-10-13 ENCOUNTER — Other Ambulatory Visit: Payer: Self-pay

## 2020-10-13 DIAGNOSIS — L988 Other specified disorders of the skin and subcutaneous tissue: Secondary | ICD-10-CM | POA: Diagnosis not present

## 2020-10-13 DIAGNOSIS — L578 Other skin changes due to chronic exposure to nonionizing radiation: Secondary | ICD-10-CM

## 2020-10-13 DIAGNOSIS — I8310 Varicose veins of unspecified lower extremity with inflammation: Secondary | ICD-10-CM

## 2020-10-13 DIAGNOSIS — L814 Other melanin hyperpigmentation: Secondary | ICD-10-CM

## 2020-10-13 DIAGNOSIS — D229 Melanocytic nevi, unspecified: Secondary | ICD-10-CM

## 2020-10-13 DIAGNOSIS — L821 Other seborrheic keratosis: Secondary | ICD-10-CM

## 2020-10-13 DIAGNOSIS — Z86018 Personal history of other benign neoplasm: Secondary | ICD-10-CM | POA: Diagnosis not present

## 2020-10-13 DIAGNOSIS — Z1283 Encounter for screening for malignant neoplasm of skin: Secondary | ICD-10-CM

## 2020-10-13 DIAGNOSIS — D1800 Hemangioma unspecified site: Secondary | ICD-10-CM

## 2020-10-13 NOTE — Patient Instructions (Signed)
Recommended non-comedogenic (non-acne causing) facial oils include 100% argan oil or squalane. The can be used after applying any recommended creams or ointments to the skin in the evening. [employer] has a high-quality and affordable version of both of these and can be found at [place].  Will prescribe [facility] Anti-Aging Tretinoin 0.025%/Niacinamide/Vitamin C/Vitamin E/Turmeric/Resveratrol with Hyaluronic Acid. Apply pea sized amount nightly to the entire face.  The patient was advised this is not covered by insurance since it is made by a compounding pharmacy. They will receive an email to check out and the medication will be mailed to their home.   Topical retinoid medications like tretinoin can cause dryness and irritation when first started. Only apply a pea-sized amount to the entire affected area. Avoid applying it around the eyes, edges of mouth and creases at the nose. If you experience irritation, use a good moisturizer first and/or apply the medicine less often. If you are doing well with the medicine, you can increase how often you use it until you are applying every night. Be careful with sun protection while using this medication as it can make you sensitive to the sun. This medicine should not be used by pregnant women.   Instructions for [facility] Medications  One or more of your medications was sent to the [facility] mail order compounding pharmacy. You will receive an email from them and can purchase the medicine through that link. It will then be mailed to your home at the address you confirmed. If for any reason you do not receive an email from them, please check your spam folder. If you still do not find the email, please let us know. [facility] phone number is [phone].   Melanoma ABCDEs  Melanoma is the most dangerous type of skin cancer, and is the leading cause of death from skin disease.  You are more likely to develop melanoma if  you: Have light-colored skin, light-colored eyes, or red or blond hair Spend a lot of time in the sun Tan regularly, either outdoors or in a tanning bed Have had blistering sunburns, especially during childhood Have a close family member who has had a melanoma Have atypical moles or large birthmarks  Early detection of melanoma is key since treatment is typically straightforward and cure rates are extremely high if we catch it early.   The first sign of melanoma is often a change in a mole or a new dark spot.  The ABCDE system is a way of remembering the signs of melanoma.  A for asymmetry:  The two halves do not match. B for border:  The edges of the growth are irregular. C for color:  A mixture of colors are present instead of an even brown color. D for diameter:  Melanomas are usually (but not always) greater than 33m - the size of a pencil eraser. E for evolution:  The spot keeps changing in size, shape, and color.  Please check your skin once per month between visits. You can use a small mirror in front and a large mirror behind you to keep an eye on the back side or your body.   If you see any new or changing lesions before your next follow-up, please call to schedule a visit.  Please continue daily skin protection including broad spectrum sunscreen SPF 30+ to sun-exposed areas, reapplying every 2 hours as needed when you're outdoors.    Recommend taking Heliocare sun protection supplement daily in sunny  weather for additional sun protection. For maximum protection on the sunniest days, you can take up to 2 capsules of regular Heliocare OR take 1 capsule of Heliocare Ultra. For prolonged exposure (such as a full day in the sun), you can repeat your dose of the supplement 4 hours after your first dose. Heliocare can be purchased at [facility] or at [email or web address].    If you have any questions or concerns for your doctor, please call our main line at [phone] and press  option 4 to reach your doctor's medical assistant. If no one answers, please leave a voicemail as directed and we will return your call as soon as possible. Messages left after 4 pm will be answered the following business day.   You may also send [place] a message via [place]. We typically respond to MyChart messages within 1-2 business days.  For prescription refills, please ask your pharmacy to contact our office. Our fax number is [phone].  If you have an urgent issue when the clinic is closed that cannot wait until the next business day, you can page your doctor at the number below.    Please note that while we do our best to be available for urgent issues outside of office hours, we are not available 24/7.   If you have an urgent issue and are unable to reach [place], you may choose to seek medical care at your doctor's office, retail clinic, urgent care center, or emergency room.  If you have a medical emergency, please immediately call 911 or go to the emergency department.  Pager Numbers  - Dr. Nehemiah Massed: [phone]  - Dr. Laurence Ferrari: [phone]  - Dr. Nicole Kindred: [phone]  In the event of inclement weather, please call our main line at [phone] for an update on the status of any delays or closures.  Dermatology Medication Tips: Please keep the boxes that topical medications come in in order to help keep track of the instructions about where and how to use these. Pharmacies typically print the medication instructions only on the boxes and not directly on the medication tubes.   If your medication is too expensive, please contact our office at [phone] option 4 or send [place] a message through [place].   We are unable to tell what your co-pay for medications will be in advance as this is different depending on your insurance coverage. However, we may be able to find a substitute medication at lower cost or fill out paperwork to get insurance to cover a needed medication.   If a  prior authorization is required to get your medication covered by your insurance company, please allow [place] 1-2 business days to complete this process.  Drug prices often vary depending on where the prescription is filled and some pharmacies may offer cheaper prices.  The website www.goodrx.com contains coupons for medications through different pharmacies. The prices here do not account for what the cost may be with help from insurance (it may be cheaper with your insurance), but the website can give you the price if you did not use any insurance.  - You can print the associated coupon and take it with your prescription to the pharmacy.  - You may also stop by our office during regular business hours and pick up a GoodRx coupon card.  - If you need your prescription sent electronically to a different pharmacy, notify our office through [facility] or by phone at [phone] option 4.

## 2020-10-13 NOTE — Progress Notes (Signed)
Follow-Up Visit   Subjective  Kelly [name] is a 35 y.o. female who presents for the following: TBSE (Patient here for full body skin exam and skin cancer screening. Patient with hx of dysplastic nevus. Patient not aware of any new or changing spots. ).  Patient does have a dark spot at back but thinks it's been there for a while.   The following portions of the chart were reviewed this encounter and updated as appropriate:   Tobacco  Allergies  Meds  Problems  Med Hx  Surg Hx  Fam Hx      Review of Systems:  No other skin or systemic complaints except as noted in HPI or Assessment and Plan.  Objective  Well appearing patient in no apparent distress; mood and affect are within normal limits.  A full examination was performed including scalp, head, eyes, ears, nose, lips, neck, chest, axillae, abdomen, back, buttocks, bilateral upper extremities, bilateral lower extremities, hands, feet, fingers, toes, fingernails, and toenails. All findings within normal limits unless otherwise noted below.  Head - Anterior (Face) Rhytides and volume loss.    Assessment & Plan  Elastosis of skin Head - Anterior (Face)  Patient interested in Botox for frown complex and forehead.  She had 35 units at [facility] in April and was very pleased with results. Will request records.  Will prescribe [facility] Anti-Aging Tretinoin 0.025%/Niacinamide/Vitamin C/Vitamin E/Turmeric/Resveratrol with Hyaluronic Acid. Apply pea sized amount nightly to the entire face.  The patient was advised this is not covered by insurance since it is made by a compounding pharmacy. They will receive an email to check out and the medication will be mailed to their home.   Topical retinoid medications like tretinoin can cause dryness and irritation when first started. Only apply a pea-sized amount to the entire affected area. Avoid applying it around the eyes, edges of mouth and creases at the nose. If you  experience irritation, use a good moisturizer first and/or apply the medicine less often. If you are doing well with the medicine, you can increase how often you use it until you are applying every night. Be careful with sun protection while using this medication as it can make you sensitive to the sun. This medicine should not be used by pregnant women.    History of Dysplastic Nevi - No evidence of recurrence today - Recommend regular full body skin exams - Recommend daily broad spectrum sunscreen SPF 30+ to sun-exposed areas, reapply every 2 hours as needed.  - Call if any new or changing lesions are noted between office visits  Lentigines - Scattered tan macules - Due to sun exposure - Benign-appering, observe - Recommend daily broad spectrum sunscreen SPF 30+ to sun-exposed areas, reapply every 2 hours as needed. - Call for any changes  Seborrheic Keratoses - Stuck-on, waxy, tan-brown papules and/or plaques  - Benign-appearing - Discussed benign etiology and prognosis. - Observe - Call for any changes  Melanocytic Nevi - Tan-brown and/or pink-flesh-colored symmetric macules and papules - Benign appearing on exam today - Observation - Call clinic for new or changing moles - Recommend daily use of broad spectrum spf 30+ sunscreen to sun-exposed areas.   Hemangiomas - Red papules - Discussed benign nature - Observe - Call for any changes  Actinic Damage - Chronic condition, secondary to cumulative UV/sun exposure - diffuse scaly erythematous macules with underlying dyspigmentation - Recommend daily broad spectrum sunscreen SPF 30+ to sun-exposed areas, reapply every 2 hours as needed.  -  Staying in the shade or wearing long sleeves, sun glasses (UVA+UVB protection) and wide brim hats (4-inch brim around the entire circumference of the hat) are also recommended for sun protection.  - Call for new or changing lesions.  Skin cancer screening performed today.  Varicose  Veins/Spider Veins - Dilated blue, purple or red veins at the lower extremities - Reassured - Smaller vessels can be treated by sclerotherapy (a procedure to inject a medicine into the veins to make them disappear) if desired, but the treatment is not covered by insurance. Larger vessels may be covered if symptomatic and we would refer to vascular surgeon if treatment desired.  Return in about 6 months (around 04/15/2021) for TBSE.  Graciella Belton, RMA, am acting as scribe for Forest Gleason, MD .  Documentation: I have reviewed the above documentation for accuracy and completeness, and I agree with the above.  Forest Gleason, MD

## 2020-10-23 ENCOUNTER — Encounter: Payer: Self-pay | Admitting: Dermatology

## 2020-11-03 ENCOUNTER — Ambulatory Visit (INDEPENDENT_AMBULATORY_CARE_PROVIDER_SITE_OTHER): Payer: Self-pay | Admitting: Dermatology

## 2020-11-03 ENCOUNTER — Other Ambulatory Visit: Payer: Self-pay

## 2020-11-03 DIAGNOSIS — L988 Other specified disorders of the skin and subcutaneous tissue: Secondary | ICD-10-CM

## 2020-11-03 NOTE — Patient Instructions (Signed)

## 2020-11-03 NOTE — Progress Notes (Signed)
   Follow-Up Visit   Subjective  Kelly [name] is a 35 y.o. female who presents for the following: Facial Elastosis (Patient here today for botox injections. ).   The following portions of the chart were reviewed this encounter and updated as appropriate:   Tobacco  Allergies  Meds  Problems  Med Hx  Surg Hx  Fam Hx      Review of Systems:  No other skin or systemic complaints except as noted in HPI or Assessment and Plan.  Objective  Well appearing patient in no apparent distress; mood and affect are within normal limits.  A focused examination was performed including face. Relevant physical exam findings are noted in the Assessment and Plan.  Face Rhytides and volume loss.             Assessment & Plan  Elastosis of skin Face  We did not receive a copy of her medical records of previous botox, and it is not completely worn off, only partially. Will err on the side of lower dosing in this case and we can always add more if needed. Also injected symmetrically today as her botox is partially worn off but not completely. However, advised she may need slightly higher dosing at left side.   Patient to take photos in 6 weeks of lifted eyebrows and scowling. This will hopefully show full effect of the botox today and not show any residual effect from her previous botox.   Patient to call if she gets an incomplete response.   Botox Injection - Face Location: See attached image  Informed consent: Discussed risks (infection, pain, bleeding, bruising, swelling, allergic reaction, paralysis of nearby muscles, eyelid droop, double vision, neck weakness, difficulty breathing, headache, undesirable cosmetic result, and need for additional treatment) and benefits of the procedure, as well as the alternatives.  Informed consent was obtained.  Preparation: The area was cleansed with alcohol.  Procedure Details:  Botox was injected into the dermis with a 30-gauge needle.  Pressure applied to any bleeding. Ice packs offered for swelling.  Lot Number:  [id number] Expiration:  08/2022  Total Units Injected:  24  Plan: Patient was instructed to remain upright for 4 hours. Patient was instructed to avoid massaging the face and avoid vigorous exercise for the rest of the day. Tylenol may be used for headache.  Allow 2 weeks before returning to clinic for additional dosing as needed. Patient will call for any problems.   Return for TBSE, as scheduled.  Graciella Belton, RMA, am acting as scribe for Forest Gleason, MD .  Documentation: I have reviewed the above documentation for accuracy and completeness, and I agree with the above.  Forest Gleason, MD

## 2020-11-08 ENCOUNTER — Encounter: Payer: Self-pay | Admitting: Dermatology

## 2021-02-23 ENCOUNTER — Encounter: Payer: Self-pay | Admitting: Dermatology

## 2021-02-23 NOTE — Telephone Encounter (Signed)
Please advise patient I would recommend decreasing her to the compounded 0.0125% tretinoin instead and see if she tolerates that one nightly. She could also try 100% argan oil instead of squalane at night and see if she feels it works any better, along with using a face moisturizer in the morning. I don't recommend an exfoliator as it's more likely to increase inflammation. Thank you!

## 2021-03-03 ENCOUNTER — Telehealth: Payer: Self-pay

## 2021-03-03 NOTE — Telephone Encounter (Signed)
Left message on voicemail to return my call in regards to rescheduling her Botox appointment. After reviewing the schedule I did find some dates and times that may work with her schedule - 03/16/21 4:10 pm, 03/21/21 4:10 pm, 04/06/21 4:20 pm, and 04/19/21 4:30 pm

## 2021-03-06 ENCOUNTER — Ambulatory Visit: Payer: Self-pay | Admitting: Dermatology

## 2021-03-16 ENCOUNTER — Ambulatory Visit (INDEPENDENT_AMBULATORY_CARE_PROVIDER_SITE_OTHER): Payer: Self-pay | Admitting: Dermatology

## 2021-03-16 ENCOUNTER — Encounter: Payer: Self-pay | Admitting: Dermatology

## 2021-03-16 ENCOUNTER — Other Ambulatory Visit: Payer: Self-pay

## 2021-03-16 DIAGNOSIS — L988 Other specified disorders of the skin and subcutaneous tissue: Secondary | ICD-10-CM

## 2021-03-16 NOTE — Patient Instructions (Signed)

## 2021-03-16 NOTE — Progress Notes (Signed)
° °  Follow-Up Visit   Subjective  Kelly [name] is a 35 y.o. female who presents for the following: Facial Elastosis (Patient here today for botox injections. ).   The following portions of the chart were reviewed this encounter and updated as appropriate:   Tobacco   Allergies   Meds   Problems   Med Hx   Surg Hx   Fam Hx       Review of Systems:  No other skin or systemic complaints except as noted in HPI or Assessment and Plan.  Objective  Well appearing patient in no apparent distress; mood and affect are within normal limits.  A focused examination was performed including face. Relevant physical exam findings are noted in the Assessment and Plan.  face Rhytides and volume loss.        Assessment & Plan  Elastosis of skin face  Patient happy with results. She does note she still has some movement at the upper forehead. Discussed increasing dose slightly today but she defers. Patient will take photos in 2 weeks. May increase dose at upper forehead at next visit  Filling material injection - face Location: See attached image  Informed consent: Discussed risks (infection, pain, bleeding, bruising, swelling, allergic reaction, paralysis of nearby muscles, eyelid droop, double vision, neck weakness, difficulty breathing, headache, undesirable cosmetic result, and need for additional treatment) and benefits of the procedure, as well as the alternatives.  Informed consent was obtained.  Preparation: The area was cleansed with alcohol.  Procedure Details:  Botox was injected into the dermis with a 30-gauge needle. Pressure applied to any bleeding. Ice packs offered for swelling.  Lot Number:  [id number] Expiration:  04/2023  Total Units Injected:  24  Plan: Tylenol may be used for headache.  Allow 2 weeks before returning to clinic for additional dosing as needed. Patient will call for any problems.    Return in about 4 months (around 07/15/2021) for Botox.  Graciella Belton, RMA, am acting as scribe for Forest Gleason, MD .  Documentation: I have reviewed the above documentation for accuracy and completeness, and I agree with the above.  Forest Gleason, MD

## 2021-03-30 ENCOUNTER — Ambulatory Visit: Payer: BC Managed Care – PPO | Admitting: Dermatology

## 2021-04-19 ENCOUNTER — Encounter: Payer: Self-pay | Admitting: Dermatology

## 2021-04-19 ENCOUNTER — Other Ambulatory Visit: Payer: Self-pay

## 2021-04-19 ENCOUNTER — Ambulatory Visit (INDEPENDENT_AMBULATORY_CARE_PROVIDER_SITE_OTHER): Payer: 59 | Admitting: Dermatology

## 2021-04-19 DIAGNOSIS — Z1283 Encounter for screening for malignant neoplasm of skin: Secondary | ICD-10-CM | POA: Diagnosis not present

## 2021-04-19 DIAGNOSIS — L578 Other skin changes due to chronic exposure to nonionizing radiation: Secondary | ICD-10-CM | POA: Diagnosis not present

## 2021-04-19 DIAGNOSIS — L821 Other seborrheic keratosis: Secondary | ICD-10-CM

## 2021-04-19 DIAGNOSIS — L308 Other specified dermatitis: Secondary | ICD-10-CM | POA: Diagnosis not present

## 2021-04-19 DIAGNOSIS — D1800 Hemangioma unspecified site: Secondary | ICD-10-CM

## 2021-04-19 DIAGNOSIS — D229 Melanocytic nevi, unspecified: Secondary | ICD-10-CM

## 2021-04-19 DIAGNOSIS — L814 Other melanin hyperpigmentation: Secondary | ICD-10-CM

## 2021-04-19 DIAGNOSIS — Z86018 Personal history of other benign neoplasm: Secondary | ICD-10-CM

## 2021-04-19 MED ORDER — TRIAMCINOLONE ACETONIDE 0.1 % EX CREA: TOPICAL_CREAM | CUTANEOUS | 2 refills | Status: DC

## 2021-04-19 NOTE — Progress Notes (Signed)
Follow-Up Visit   Subjective  Kelly [name] is a 36 y.o. female who presents for the following: Annual Exam (Here for skin cancer screening. Full body. 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  The patient presents for Total-Body Skin Exam (TBSE) for skin cancer screening and mole check.  The patient has spots, moles and lesions to be evaluated, some may be new or changing and the patient has concerns that these could be cancer.  The following portions of the chart were reviewed this encounter and updated as appropriate:  Tobacco   Allergies   Meds   Problems   Med Hx   Surg Hx   Fam Hx       Review of Systems: No other skin or systemic complaints except as noted in HPI or Assessment and Plan.   Objective  Well appearing patient in no apparent distress; mood and affect are within normal limits.  A full examination was performed including scalp, head, eyes, ears, nose, lips, neck, chest, axillae, abdomen, back, buttocks, bilateral upper extremities, bilateral lower extremities, hands, feet, fingers, toes, fingernails, and toenails. All findings within normal limits unless otherwise noted below.  B/L hands Scaly pink thin plaques   Assessment & Plan   Lentigines - Scattered tan macules - Due to sun exposure - Benign-appearing, observe - Recommend daily broad spectrum sunscreen SPF 30+ to sun-exposed areas, reapply every 2 hours as needed. - Call for any changes  Seborrheic Keratoses - Stuck-on, waxy, tan-brown papules and/or plaques  - Benign-appearing - Discussed benign etiology and prognosis. - Observe - Call for any changes  Melanocytic Nevi - Tan-brown and/or pink-flesh-colored symmetric macules and papules - Benign appearing on exam today - Observation - Call clinic for new or changing moles - Recommend daily use of broad spectrum spf 30+ sunscreen to sun-exposed areas.   Hemangiomas - Red papules - Discussed benign nature - Observe - Call  for any changes  Actinic Damage - Chronic condition, secondary to cumulative UV/sun exposure - diffuse scaly erythematous macules with underlying dyspigmentation - Recommend daily broad spectrum sunscreen SPF 30+ to sun-exposed areas, reapply every 2 hours as needed.  - Staying in the shade or wearing long sleeves, sun glasses (UVA+UVB protection) and wide brim hats (4-inch brim around the entire circumference of the hat) are also recommended for sun protection.  - Call for new or changing lesions.  History of Dysplastic Nevus with severe atypia - No evidence of recurrence today at left inferior breast - Recommend regular full body skin exams - Recommend daily broad spectrum sunscreen SPF 30+ to sun-exposed areas, reapply every 2 hours as needed.  - Call if any new or changing lesions are noted between office visits   Skin cancer screening performed today.   Other eczema B/L hands  Chronic and persistent condition with duration or expected duration over one year. Condition is bothersome/symptomatic for patient. Currently flared.  Start Triamcinolone cream twice daily to hands up to 2 weeks. Avoid applying to face, groin, and axilla. Use as directed. Long-term use can cause thinning of the skin.  Topical steroids (such as triamcinolone, fluocinolone, fluocinonide, mometasone, clobetasol, halobetasol, betamethasone, hydrocortisone) can cause thinning and lightening of the skin if they are used for too long in the same area. Your physician has selected the right strength medicine for your problem and area affected on the body. Please use your medication only as directed by your physician to prevent side effects.   Reviewed gentle skin care for  hands  triamcinolone cream (KENALOG) 0.1 % - B/L hands Apply twice daily to hands up to 2 weeks PRN   Return in about 1 year (around 04/19/2022) for TBSE.  I, Emelia Salisbury, CMA, am acting as scribe for Forest Gleason, MD.  Documentation: I have  reviewed the above documentation for accuracy and completeness, and I agree with the above.  Forest Gleason, MD

## 2021-04-19 NOTE — Patient Instructions (Addendum)
Recommend taking Heliocare sun protection supplement daily in sunny weather for additional sun protection. For maximum protection on the sunniest days, you can take up to 2 capsules of regular Heliocare OR take 1 capsule of Heliocare Ultra. For prolonged exposure (such as a full day in the sun), you can repeat your dose of the supplement 4 hours after your first dose. Heliocare can be purchased at [employer], at some [facility] or at [email or web address].    Gentle Skin Care Guide  1. Bathe no more than once a day.  2. Avoid bathing in hot water  3. Use a mild soap like Dove, Vanicream, Cetaphil, CeraVe. Can use Lever 2000 or Cetaphil antibacterial soap  4. Use soap only where you need it. On most days, use it under your arms, between your legs, and on your feet. Let the water rinse other areas unless visibly dirty.  5. When you get out of the bath/shower, use a towel to gently blot your skin dry, don't rub it.  6. While your skin is still a little damp, apply a moisturizing cream such as Vanicream, CeraVe, Cetaphil, Eucerin, Sarna lotion or plain Vaseline Jelly. For hands apply Neutrogena [place] Hand Cream or Excipial Hand Cream.  7. Reapply moisturizer any time you start to itch or feel dry.  8. Sometimes using free and clear laundry detergents can be helpful. Fabric softener sheets should be avoided. Downy Free & Gentle liquid, or any liquid fabric softener that is free of dyes and perfumes, it acceptable to use  9. If your doctor has given you prescription creams you may apply moisturizers over them   Recommended non-comedogenic (non-acne causing) facial oils include 100% argan oil or squalane. The can be used after applying any recommended creams or ointments to the skin in the evening. The Ordinary Brand has a high-quality and affordable version of both of these and can be found at [place].   Hand Dermatitis is a chronic type of eczema that can come and go on the hands  and fingers.  While there is no cure, the rash and symptoms can be managed with topical prescription medications, and for more severe cases, with systemic medications.  Recommend mild soap and routine use of moisturizing cream after handwashing.  Minimize soap/water exposure when possible.     Start Triamcinolone cream twice daily to hands up to 2 weeks. Avoid applying to face, groin, and axilla. Use as directed. Long-term use can cause thinning of the skin.  Topical steroids (such as triamcinolone, fluocinolone, fluocinonide, mometasone, clobetasol, halobetasol, betamethasone, hydrocortisone) can cause thinning and lightening of the skin if they are used for too long in the same area. Your physician has selected the right strength medicine for your problem and area affected on the body. Please use your medication only as directed by your physician to prevent side effects.    Some Recommended Sunscreens Include:  Good for Daily Wear (feels like lotion but NOT sweat resistant) Cerave AM Moisturizer with SPF EltaMD UV Lotion  Body or All Over Sunscreen EltaMD UV active for body and face Blue lizard sensitive Sun bum mineral (avoid if sensitive to scent) Aveeno Positively Mineral Neutrogena sheer zinc (Slightly harder to rub in) [facility] clear zinc (Slightly harder to rub in)  Clear Face Sunscreen EltaMD UV Elements CeraVe hydrating sunscreen 50 face  Tinted Face Sunscreen Alastin Hydratint (good for most skin tones, may be slightly dark if you are very fair) Colorescience Sunforgettable Total Protection Face Shield (good for  most skin tones) EltaMD UV Physical La Roche Posay Mineral Tinted Cotz Flawless Complexion   Powder Sunscreen (Nice for reapplying or applying on the go) Colorescience Sunforgettable Total Protection Brush on Shield (available in different tints)  Face Sunscreen Available in Different Tints Colorescience Sunforgettable Total Protection Brush on Shield  bareMinerals  Complexion Rescue Tinted Hydrating Gel Cream Broad Spectrum SPF 30 UnSun mineral tinted (comes in medium/dark and light/medium)  Kids (over 6 months) - Mineral Sunscreens Recommended eltaMD UV Pure MDsolarSciences KidStick 40 SPF Aveeno Baby Continuous Protection Sensitive Zinc Oxide Blue Lizard Kids mineral based sunscreen lotion Mustela Mineral Sunscreen for face and body Neutrogena Sheer Zinc Kids Sunscreen Stick  Tinted to look like a tan PCAskin sheer tint body spray   Melanoma ABCDEs  Melanoma is the most dangerous type of skin cancer, and is the leading cause of death from skin disease.  You are more likely to develop melanoma if you: Have light-colored skin, light-colored eyes, or red or blond hair Spend a lot of time in the sun Tan regularly, either outdoors or in a tanning bed Have had blistering sunburns, especially during childhood Have a close family member who has had a melanoma Have atypical moles or large birthmarks  Early detection of melanoma is key since treatment is typically straightforward and cure rates are extremely high if we catch it early.   The first sign of melanoma is often a change in a mole or a new dark spot.  The ABCDE system is a way of remembering the signs of melanoma.  A for asymmetry:  The two halves do not match. B for border:  The edges of the growth are irregular. C for color:  A mixture of colors are present instead of an even brown color. D for diameter:  Melanomas are usually (but not always) greater than 25mm - the size of a pencil eraser. E for evolution:  The spot keeps changing in size, shape, and color.  Please check your skin once per month between visits. You can use a small mirror in front and a large mirror behind you to keep an eye on the back side or your body.   If you see any new or changing lesions before your next follow-up, please call to schedule a visit.  Please continue daily skin protection including broad spectrum  sunscreen SPF 30+ to sun-exposed areas, reapplying every 2 hours as needed when you're outdoors.   Staying in the shade or wearing long sleeves, sun glasses (UVA+UVB protection) and wide brim hats (4-inch brim around the entire circumference of the hat) are also recommended for sun protection.     If You Need Anything After Your Visit  If you have any questions or concerns for your doctor, please call our main line at [phone] and press option 4 to reach your doctor's medical assistant. If no one answers, please leave a voicemail as directed and we will return your call as soon as possible. Messages left after 4 pm will be answered the following business day.   You may also send [place] a message via [place]. We typically respond to MyChart messages within 1-2 business days.  For prescription refills, please ask your pharmacy to contact our office. Our fax number is [phone].  If you have an urgent issue when the clinic is closed that cannot wait until the next business day, you can page your doctor at the number below.    Please note that while we do our best to  be available for urgent issues outside of office hours, we are not available 24/7.   If you have an urgent issue and are unable to reach [place], you may choose to seek medical care at your doctor's office, retail clinic, urgent care center, or emergency room.  If you have a medical emergency, please immediately call 911 or go to the emergency department.  Pager Numbers  - Dr. Nehemiah Massed: [phone]  - Dr. Laurence Ferrari: [phone]  - Dr. Nicole Kindred: [phone]  In the event of inclement weather, please call our main line at [phone] for an update on the status of any delays or closures.  Dermatology Medication Tips: Please keep the boxes that topical medications come in in order to help keep track of the instructions about where and how to use these. Pharmacies typically print the medication instructions only on the boxes and not  directly on the medication tubes.   If your medication is too expensive, please contact our office at [phone] option 4 or send [place] a message through [place].   We are unable to tell what your co-pay for medications will be in advance as this is different depending on your insurance coverage. However, we may be able to find a substitute medication at lower cost or fill out paperwork to get insurance to cover a needed medication.   If a prior authorization is required to get your medication covered by your insurance company, please allow [place] 1-2 business days to complete this process.  Drug prices often vary depending on where the prescription is filled and some pharmacies may offer cheaper prices.  The website www.goodrx.com contains coupons for medications through different pharmacies. The prices here do not account for what the cost may be with help from insurance (it may be cheaper with your insurance), but the website can give you the price if you did not use any insurance.  - You can print the associated coupon and take it with your prescription to the pharmacy.  - You may also stop by our office during regular business hours and pick up a GoodRx coupon card.  - If you need your prescription sent electronically to a different pharmacy, notify our office through [facility] or by phone at [phone] option 4.     Si Usted Necesita Algo Despus de Su Visita  Tambin puede enviarnos un mensaje a travs de Pharmacist, community. Por lo general respondemos a los mensajes de MyChart en el transcurso de 1 a 2 das hbiles.  Para renovar recetas, por favor pida a su farmacia que se ponga en contacto con nuestra oficina. Harland Dingwall de fax es [place] [phone].  Si tiene un asunto urgente cuando la clnica est cerrada y que no puede esperar hasta el siguiente da hbil, puede llamar/localizar a su doctor(a) al nmero que aparece a continuacin.   Por favor, tenga en cuenta que aunque hacemos  todo lo posible para estar disponibles para asuntos urgentes fuera del horario de [place], no estamos disponibles las 24 horas del da, los 7 das de la [place].   Si tiene un problema urgente y no puede comunicarse con nosotros, puede optar por buscar atencin mdica  en el consultorio de su doctor(a), en una clnica privada, en un centro de atencin urgente o en una sala de emergencias.  Si tiene Engineering geologist, por favor llame inmediatamente al 911 o vaya a la sala de emergencias.  Nmeros de bper  - Dr. Nehemiah Massed: [phone]  - Dra. Moye: [phone]  - Dra.  Nicole Kindred: [phone]  En caso de inclemencias del [place], por favor llame a Johnsie Kindred principal al [phone] para una actualizacin sobre el [place] de cualquier retraso o cierre.  Consejos para la medicacin en dermatologa: Por favor, guarde las cajas en las que vienen los medicamentos de uso tpico para ayudarle a seguir las instrucciones sobre dnde y cmo usarlos. Las farmacias generalmente imprimen las instrucciones del medicamento slo en las cajas y no directamente en los tubos del [place].   Si su medicamento es muy caro, por favor, pngase en contacto con Zigmund Daniel llamando al [phone] y presione la opcin 4 o envenos un mensaje a travs de Pharmacist, community.   No podemos decirle cul ser su copago por los medicamentos por adelantado ya que esto es diferente dependiendo de la cobertura de su seguro. Sin embargo, es posible que podamos encontrar un medicamento sustituto a Electrical engineer un formulario para que el seguro cubra el medicamento que se considera necesario.   Si se requiere una autorizacin previa para que su compaa de seguros [place] su medicamento, por favor permtanos de 1 a 2 das hbiles para completar este proceso.  Los precios de los medicamentos varan con frecuencia dependiendo del Environmental consultant de dnde se surte la receta y alguna farmacias pueden ofrecer precios ms baratos.  El  sitio web www.goodrx.com tiene cupones para medicamentos de Airline pilot. Los precios aqu no tienen en cuenta lo que podra costar con la ayuda del seguro (puede ser ms barato con su seguro), pero el sitio web puede darle el precio si no utiliz Research scientist (physical sciences).  - Puede imprimir el cupn correspondiente y llevarlo con su receta a la farmacia.  - Tambin puede pasar por nuestra oficina durante el horario de atencin regular y Charity fundraiser una tarjeta de cupones de GoodRx.  - Si necesita que su receta se enve electrnicamente a una farmacia diferente, informe a nuestra oficina a travs de MyChart de [place] o por telfono llamando al [phone] y presione la opcin 4.

## 2021-04-26 ENCOUNTER — Encounter: Payer: Self-pay | Admitting: Dermatology

## 2021-04-26 ENCOUNTER — Ambulatory Visit (INDEPENDENT_AMBULATORY_CARE_PROVIDER_SITE_OTHER): Payer: 59 | Admitting: Dermatology

## 2021-04-26 ENCOUNTER — Other Ambulatory Visit: Payer: Self-pay

## 2021-04-26 DIAGNOSIS — R21 Rash and other nonspecific skin eruption: Secondary | ICD-10-CM

## 2021-04-26 MED ORDER — VALACYCLOVIR HCL 1 G PO TABS: 1000.0000 mg | ORAL_TABLET | Freq: Three times a day (TID) | ORAL | 0 refills | Status: AC

## 2021-04-26 MED ORDER — CLOBETASOL PROPIONATE 0.05 % EX OINT: 1.0000 "application " | TOPICAL_OINTMENT | Freq: Two times a day (BID) | CUTANEOUS | 0 refills | Status: DC

## 2021-04-26 NOTE — Patient Instructions (Addendum)
Zoster vs Contact Dermatitis  Recommend over the counter lidocaine as needed.   Start valacyclovir 1 gram three times a day for 7 days Start clobetasol ointment twice daily for up to 2 weeks. Avoid applying to face, groin, and axilla. Use as directed. Long-term use can cause thinning of the skin.  Topical steroids (such as triamcinolone, fluocinolone, fluocinonide, mometasone, clobetasol, halobetasol, betamethasone, hydrocortisone) can cause thinning and lightening of the skin if they are used for too long in the same area. Your physician has selected the right strength medicine for your problem and area affected on the body. Please use your medication only as directed by your physician to prevent side effects.   If You Need Anything After Your Visit  If you have any questions or concerns for your doctor, please call our main line at [phone] and press option 4 to reach your doctor's medical assistant. If no one answers, please leave a voicemail as directed and we will return your call as soon as possible. Messages left after 4 pm will be answered the following business day.   You may also send [place] a message via [place]. We typically respond to MyChart messages within 1-2 business days.  For prescription refills, please ask your pharmacy to contact our office. Our fax number is [phone].  If you have an urgent issue when the clinic is closed that cannot wait until the next business day, you can page your doctor at the number below.    Please note that while we do our best to be available for urgent issues outside of office hours, we are not available 24/7.   If you have an urgent issue and are unable to reach [place], you may choose to seek medical care at your doctor's office, retail clinic, urgent care center, or emergency room.  If you have a medical emergency, please immediately call 911 or go to the emergency department.  Pager Numbers  - Dr. Nehemiah Massed: [phone]  - Dr. Laurence Ferrari:  [phone]  - Dr. Nicole Kindred: [phone]  In the event of inclement weather, please call our main line at [phone] for an update on the status of any delays or closures.  Dermatology Medication Tips: Please keep the boxes that topical medications come in in order to help keep track of the instructions about where and how to use these. Pharmacies typically print the medication instructions only on the boxes and not directly on the medication tubes.   If your medication is too expensive, please contact our office at [phone] option 4 or send [place] a message through [place].   We are unable to tell what your co-pay for medications will be in advance as this is different depending on your insurance coverage. However, we may be able to find a substitute medication at lower cost or fill out paperwork to get insurance to cover a needed medication.   If a prior authorization is required to get your medication covered by your insurance company, please allow [place] 1-2 business days to complete this process.  Drug prices often vary depending on where the prescription is filled and some pharmacies may offer cheaper prices.  The website www.goodrx.com contains coupons for medications through different pharmacies. The prices here do not account for what the cost may be with help from insurance (it may be cheaper with your insurance), but the website can give you the price if you did not use any insurance.  - You can print the associated coupon and take it with your prescription  to the pharmacy.  - You may also stop by our office during regular business hours and pick up a GoodRx coupon card.  - If you need your prescription sent electronically to a different pharmacy, notify our office through [facility] or by phone at [phone] option 4.     Si Usted Necesita Algo Despus de Su Visita  Tambin puede enviarnos un mensaje a travs de Pharmacist, community. Por lo general respondemos a los mensajes de  MyChart en el transcurso de 1 a 2 das hbiles.  Para renovar recetas, por favor pida a su farmacia que se ponga en contacto con nuestra oficina. Harland Dingwall de fax es [place] [phone].  Si tiene un asunto urgente cuando la clnica est cerrada y que no puede esperar hasta el siguiente da hbil, puede llamar/localizar a su doctor(a) al nmero que aparece a continuacin.   Por favor, tenga en cuenta que aunque hacemos todo lo posible para estar disponibles para asuntos urgentes fuera del horario de [place], no estamos disponibles las 24 horas del da, los 7 das de la [place].   Si tiene un problema urgente y no puede comunicarse con nosotros, puede optar por buscar atencin mdica  en el consultorio de su doctor(a), en una clnica privada, en un centro de atencin urgente o en una sala de emergencias.  Si tiene Engineering geologist, por favor llame inmediatamente al 911 o vaya a la sala de emergencias.  Nmeros de bper  - Dr. Nehemiah Massed: [phone]  - Dra. Moye: [phone]  - Dra. Nicole Kindred: [phone]  En caso de inclemencias del [place], por favor llame a Johnsie Kindred principal al [phone] para una actualizacin sobre el [place] de cualquier retraso o cierre.  Consejos para la medicacin en dermatologa: Por favor, guarde las cajas en las que vienen los medicamentos de uso tpico para ayudarle a seguir las instrucciones sobre dnde y cmo usarlos. Las farmacias generalmente imprimen las instrucciones del medicamento slo en las cajas y no directamente en los tubos del [place].   Si su medicamento es muy caro, por favor, pngase en contacto con Zigmund Daniel llamando al [phone] y presione la opcin 4 o envenos un mensaje a travs de Pharmacist, community.   No podemos decirle cul ser su copago por los medicamentos por adelantado ya que esto es diferente dependiendo de la cobertura de su seguro. Sin embargo, es posible que podamos encontrar un medicamento sustituto a Contractor un formulario para que el seguro cubra el medicamento que se considera necesario.   Si se requiere una autorizacin previa para que su compaa de seguros [place] su medicamento, por favor permtanos de 1 a 2 das hbiles para completar este proceso.  Los precios de los medicamentos varan con frecuencia dependiendo del Environmental consultant de dnde se surte la receta y alguna farmacias pueden ofrecer precios ms baratos.  El sitio web www.goodrx.com tiene cupones para medicamentos de Airline pilot. Los precios aqu no tienen en cuenta lo que podra costar con la ayuda del seguro (puede ser ms barato con su seguro), pero el sitio web puede darle el precio si no utiliz Research scientist (physical sciences).  - Puede imprimir el cupn correspondiente y llevarlo con su receta a la farmacia.  - Tambin puede pasar por nuestra oficina durante el horario de atencin regular y Charity fundraiser una tarjeta de cupones de GoodRx.  - Si necesita que su receta se enve electrnicamente a Chiropodist, informe a nuestra oficina a travs de MyChart de [employer] o por  telfono llamando al [phone] y presione la opcin 4.

## 2021-04-26 NOTE — Progress Notes (Signed)
° °  Follow-Up Visit   Subjective  Kelly [name] is a 36 y.o. female who presents for the following: Rash (Patient here today for a burning rash at neck since Monday. Patient did try Amaya that she has at home but it did not help. ).  The following portions of the chart were reviewed this encounter and updated as appropriate:   Tobacco   Allergies   Meds   Problems   Med Hx   Surg Hx   Fam Hx       Review of Systems:  No other skin or systemic complaints except as noted in HPI or Assessment and Plan.  Objective  Well appearing patient in no apparent distress; mood and affect are within normal limits.  A focused examination was performed including neck. Relevant physical exam findings are noted in the Assessment and Plan.  neck Red papules coalescing to plaques primarily left neck, left of midline with few clustered erythematous papules at right neck    Assessment & Plan  Rash neck  Zoster vs Contact Dermatitis  Recommend over the counter lidocaine as needed.   Start valacyclovir 1 gram three times a day for 7 days. Take with large glass of water.  Start clobetasol ointment twice daily for up to 2 weeks. Avoid applying to face, groin, and axilla. Use as directed. Long-term use can cause thinning of the skin.  Topical steroids (such as triamcinolone, fluocinolone, fluocinonide, mometasone, clobetasol, halobetasol, betamethasone, hydrocortisone) can cause thinning and lightening of the skin if they are used for too long in the same area. Your physician has selected the right strength medicine for your problem and area affected on the body. Please use your medication only as directed by your physician to prevent side effects.    clobetasol ointment (TEMOVATE) 0.05 % - neck Apply 1 application topically 2 (two) times daily. For up to 2 weeks. Avoid applying to face, groin, and axilla. Use as directed. Long-term use can cause thinning of the skin.  valACYclovir (VALTREX) 1000 MG  tablet - neck Take 1 tablet (1,000 mg total) by mouth 3 (three) times daily for 7 days.  Related Procedures HSV and VZV PCR Panel   Return for TBSE, as scheduled.  Graciella Belton, RMA, am acting as scribe for Forest Gleason, MD .  Documentation: I have reviewed the above documentation for accuracy and completeness, and I agree with the above.  Forest Gleason, MD

## 2021-04-26 NOTE — Telephone Encounter (Signed)
Can you please offer this patient an appointment today? I want to be sure this is not Zoster. Lunchtime ok or end of day. Thank you!

## 2021-04-27 ENCOUNTER — Encounter: Payer: Self-pay | Admitting: Dermatology

## 2021-04-29 LAB — HSV AND VZV PCR PANEL
HSV 2 DNA: NEGATIVE
HSV-1 DNA: NEGATIVE
Varicella-Zoster, PCR: NEGATIVE

## 2021-05-03 ENCOUNTER — Encounter: Payer: Self-pay | Admitting: Dermatology

## 2021-07-18 ENCOUNTER — Ambulatory Visit (INDEPENDENT_AMBULATORY_CARE_PROVIDER_SITE_OTHER): Payer: 59 | Admitting: Dermatology

## 2021-07-18 ENCOUNTER — Encounter: Payer: Self-pay | Admitting: Dermatology

## 2021-07-18 DIAGNOSIS — L988 Other specified disorders of the skin and subcutaneous tissue: Secondary | ICD-10-CM

## 2021-07-18 NOTE — Patient Instructions (Signed)

## 2021-07-18 NOTE — Progress Notes (Signed)
? ?  Follow-Up Visit ?  ?Subjective  ?Kelly [name] is a 36 y.o. female who presents for the following: Facial Elastosis (Here for Botox). ? ?The following portions of the chart were reviewed this encounter and updated as appropriate:  Tobacco  Allergies  Meds  Problems  Med Hx  Surg Hx  Fam Hx   ?  ?Review of Systems: No other skin or systemic complaints except as noted in HPI or Assessment and Plan. ? ?Objective  ?Well appearing patient in no apparent distress; mood and affect are within normal limits. ? ?A focused examination was performed including face. Relevant physical exam findings are noted in the Assessment and Plan. ? ?face ?Rhytides and volume loss.  ? ? ? ? ? ?Assessment & Plan  ?Elastosis of skin ?face ?Botox 25 units injected as noted below. ? ?Forehead 5  units ?Frown complex: 20 units ? ?Botox Injection - face ?Location: See attached image ? ?Informed consent: Discussed risks (infection, pain, bleeding, bruising, swelling, allergic reaction, paralysis of nearby muscles, eyelid droop, double vision, neck weakness, difficulty breathing, headache, undesirable cosmetic result, and need for additional treatment) and benefits of the procedure, as well as the alternatives.  Informed consent was obtained. ? ?Preparation: The area was cleansed with alcohol. ? ?Procedure Details:  Botox was injected into the dermis with a 30-gauge needle. Pressure applied to any bleeding. Ice packs offered for swelling. ? ?Lot Number:  [id number] ?Expiration:  06/2023 ? ?Total Units Injected:  25 ? ?Plan: Patient was instructed to remain upright for 4 hours. Patient was instructed to avoid massaging the face and avoid vigorous exercise for the rest of the day. Tylenol may be used for headache.  Allow 2 weeks before returning to clinic for additional dosing as needed. Patient will call for any problems. ? ?Return for Botox 3-4 months. ? ?I, Emelia Salisbury, CMA, am acting as scribe for Sarina Ser, MD. ?Documentation:  I have reviewed the above documentation for accuracy and completeness, and I agree with the above. ? ?Sarina Ser, MD ? ? ?

## 2021-07-26 ENCOUNTER — Encounter: Payer: Self-pay | Admitting: Dermatology

## 2021-11-02 ENCOUNTER — Other Ambulatory Visit: Payer: Self-pay | Admitting: Obstetrics and Gynecology

## 2021-11-02 DIAGNOSIS — N63 Unspecified lump in unspecified breast: Secondary | ICD-10-CM

## 2021-11-02 DIAGNOSIS — N6314 Unspecified lump in the right breast, lower inner quadrant: Secondary | ICD-10-CM

## 2021-11-23 ENCOUNTER — Ambulatory Visit (INDEPENDENT_AMBULATORY_CARE_PROVIDER_SITE_OTHER): Payer: Self-pay | Admitting: Dermatology

## 2021-11-23 DIAGNOSIS — L988 Other specified disorders of the skin and subcutaneous tissue: Secondary | ICD-10-CM

## 2021-11-23 NOTE — Patient Instructions (Signed)
Due to recent changes in healthcare laws, you may see results of your pathology and/or laboratory studies on MyChart before the doctors have had a chance to review them. We understand that in some cases there may be results that are confusing or concerning to you. Please understand that not all results are received at the same time and often the doctors may need to interpret multiple results in order to provide you with the best plan of care or course of treatment. Therefore, we ask that you please give us 2 business days to thoroughly review all your results before contacting the office for clarification. Should we see a critical lab result, you will be contacted sooner.   If You Need Anything After Your Visit  If you have any questions or concerns for your doctor, please call our main line at [phone] and press option 4 to reach your doctor's medical assistant. If no one answers, please leave a voicemail as directed and we will return your call as soon as possible. Messages left after 4 pm will be answered the following business day.   You may also send us a message via MyChart. We typically respond to MyChart messages within 1-2 business days.  For prescription refills, please ask your pharmacy to contact our office. Our fax number is [phone].  If you have an urgent issue when the clinic is closed that cannot wait until the next business day, you can page your doctor at the number below.    Please note that while we do our best to be available for urgent issues outside of office hours, we are not available 24/7.   If you have an urgent issue and are unable to reach us, you may choose to seek medical care at your doctor's office, retail clinic, urgent care center, or emergency room.  If you have a medical emergency, please immediately call 911 or go to the emergency department.  Pager Numbers  - Dr. Kowalski: [phone]  - Dr. Moye: [phone]  - Dr. Stewart:  [phone]  In the event of inclement weather, please call our main line at [phone] for an update on the status of any delays or closures.  Dermatology Medication Tips: Please keep the boxes that topical medications come in in order to help keep track of the instructions about where and how to use these. Pharmacies typically print the medication instructions only on the boxes and not directly on the medication tubes.   If your medication is too expensive, please contact our office at [phone] option 4 or send us a message through MyChart.   We are unable to tell what your co-pay for medications will be in advance as this is different depending on your insurance coverage. However, we may be able to find a substitute medication at lower cost or fill out paperwork to get insurance to cover a needed medication.   If a prior authorization is required to get your medication covered by your insurance company, please allow us 1-2 business days to complete this process.  Drug prices often vary depending on where the prescription is filled and some pharmacies may offer cheaper prices.  The website www.goodrx.com contains coupons for medications through different pharmacies. The prices here do not account for what the cost may be with help from insurance (it may be cheaper with your insurance), but the website can give you the price if you did not use any insurance.  - You can print the associated coupon and take it with   your prescription to the pharmacy.  - You may also stop by our office during regular business hours and pick up a GoodRx coupon card.  - If you need your prescription sent electronically to a different pharmacy, notify our office through [facility] MyChart or by phone at [phone] option 4.     Si Usted Necesita Algo Despus de Su Visita  Tambin puede enviarnos un mensaje a travs de MyChart. Por lo general respondemos a los mensajes de MyChart en el transcurso de 1 a 2  das hbiles.  Para renovar recetas, por favor pida a su farmacia que se ponga en contacto con nuestra oficina. Nuestro nmero de fax es el [phone].  Si tiene un asunto urgente cuando la clnica est cerrada y que no puede esperar hasta el siguiente da hbil, puede llamar/localizar a su doctor(a) al nmero que aparece a continuacin.   Por favor, tenga en cuenta que aunque hacemos todo lo posible para estar disponibles para asuntos urgentes fuera del horario de oficina, no estamos disponibles las 24 horas del da, los 7 das de la semana.   Si tiene un problema urgente y no puede comunicarse con nosotros, puede optar por buscar atencin mdica  en el consultorio de su doctor(a), en una clnica privada, en un centro de atencin urgente o en una sala de emergencias.  Si tiene una emergencia mdica, por favor llame inmediatamente al 911 o vaya a la sala de emergencias.  Nmeros de bper  - Dr. Kowalski: [phone]  - Dra. Moye: [phone]  - Dra. Stewart: [phone]  En caso de inclemencias del tiempo, por favor llame a nuestra lnea principal al [phone] para una actualizacin sobre el estado de cualquier retraso o cierre.  Consejos para la medicacin en dermatologa: Por favor, guarde las cajas en las que vienen los medicamentos de uso tpico para ayudarle a seguir las instrucciones sobre dnde y cmo usarlos. Las farmacias generalmente imprimen las instrucciones del medicamento slo en las cajas y no directamente en los tubos del medicamento.   Si su medicamento es muy caro, por favor, pngase en contacto con nuestra oficina llamando al [phone] y presione la opcin 4 o envenos un mensaje a travs de MyChart.   No podemos decirle cul ser su copago por los medicamentos por adelantado ya que esto es diferente dependiendo de la cobertura de su seguro. Sin embargo, es posible que podamos encontrar un medicamento sustituto a menor costo o llenar un formulario para que el  seguro cubra el medicamento que se considera necesario.   Si se requiere una autorizacin previa para que su compaa de seguros cubra su medicamento, por favor permtanos de 1 a 2 das hbiles para completar este proceso.  Los precios de los medicamentos varan con frecuencia dependiendo del lugar de dnde se surte la receta y alguna farmacias pueden ofrecer precios ms baratos.  El sitio web www.goodrx.com tiene cupones para medicamentos de diferentes farmacias. Los precios aqu no tienen en cuenta lo que podra costar con la ayuda del seguro (puede ser ms barato con su seguro), pero el sitio web puede darle el precio si no utiliz ningn seguro.  - Puede imprimir el cupn correspondiente y llevarlo con su receta a la farmacia.  - Tambin puede pasar por nuestra oficina durante el horario de atencin regular y recoger una tarjeta de cupones de GoodRx.  - Si necesita que su receta se enve electrnicamente a una farmacia diferente, informe a nuestra oficina a travs de MyChart de [facility]   o por telfono llamando al [phone] y presione la opcin 4.  

## 2021-11-23 NOTE — Progress Notes (Signed)
   Follow-Up Visit   Subjective  Kelly [name] is a 36 y.o. female who presents for the following: Facial Elastosis (Patient here today for Botox injections. ).  The following portions of the chart were reviewed this encounter and updated as appropriate:   Tobacco  Allergies  Meds  Problems  Med Hx  Surg Hx  Fam Hx      Review of Systems:  No other skin or systemic complaints except as noted in HPI or Assessment and Plan.  Objective  Well appearing patient in no apparent distress; mood and affect are within normal limits.  A focused examination was performed including face. Relevant physical exam findings are noted in the Assessment and Plan.  face Rhytides and volume loss.     Assessment & Plan  Elastosis of skin face  Botox 25 units Frown Complex - 20 units Forehead - 5 units  Filling material injection - face Location: See attached image  Informed consent: Discussed risks (infection, pain, bleeding, bruising, swelling, allergic reaction, paralysis of nearby muscles, eyelid droop, double vision, neck weakness, difficulty breathing, headache, undesirable cosmetic result, and need for additional treatment) and benefits of the procedure, as well as the alternatives.  Informed consent was obtained.  Preparation: The area was cleansed with alcohol.  Procedure Details:  Botox was injected into the dermis with a 30-gauge needle. Pressure applied to any bleeding. Ice packs offered for swelling.  Lot Number:  [id number] Expiration:  01/2024  Total Units Injected:  25  Plan: Tylenol may be used for headache.  Allow 2 weeks before returning to clinic for additional dosing as needed. Patient will call for any problems.    Return in about 4 months (around 03/25/2022) for Botox.  Graciella Belton, RMA, am acting as scribe for Forest Gleason, MD .  Documentation: I have reviewed the above documentation for accuracy and completeness, and I agree with the  above.  Forest Gleason, MD

## 2021-11-28 ENCOUNTER — Ambulatory Visit
Admission: RE | Admit: 2021-11-28 | Discharge: 2021-11-28 | Disposition: A | Discharge status: Home / Self Care | Payer: 59 | Source: Ambulatory Visit | Attending: Obstetrics and Gynecology | Admitting: Obstetrics and Gynecology

## 2021-11-28 ENCOUNTER — Other Ambulatory Visit: Payer: Self-pay | Admitting: Obstetrics and Gynecology

## 2021-11-28 DIAGNOSIS — N63 Unspecified lump in unspecified breast: Secondary | ICD-10-CM

## 2021-11-28 DIAGNOSIS — N6489 Other specified disorders of breast: Secondary | ICD-10-CM

## 2021-11-28 DIAGNOSIS — N6314 Unspecified lump in the right breast, lower inner quadrant: Secondary | ICD-10-CM

## 2021-12-04 ENCOUNTER — Encounter: Payer: Self-pay | Admitting: Dermatology

## 2021-12-07 ENCOUNTER — Ambulatory Visit
Admission: RE | Admit: 2021-12-07 | Discharge: 2021-12-07 | Disposition: A | Discharge status: Home / Self Care | Payer: 59 | Source: Ambulatory Visit | Attending: Obstetrics and Gynecology | Admitting: Obstetrics and Gynecology

## 2021-12-07 ENCOUNTER — Other Ambulatory Visit: Payer: Self-pay | Admitting: Obstetrics and Gynecology

## 2021-12-07 DIAGNOSIS — N6489 Other specified disorders of breast: Secondary | ICD-10-CM

## 2022-01-02 ENCOUNTER — Encounter: Payer: Self-pay | Admitting: Dermatology

## 2022-01-02 NOTE — Telephone Encounter (Signed)
Yes, we can send for the 0.05% tretinoin with hyaluronic acid from [facility]. Thank you!

## 2022-02-22 ENCOUNTER — Ambulatory Visit (INDEPENDENT_AMBULATORY_CARE_PROVIDER_SITE_OTHER): Payer: 59 | Admitting: Dermatology

## 2022-02-22 DIAGNOSIS — L309 Dermatitis, unspecified: Secondary | ICD-10-CM | POA: Diagnosis not present

## 2022-02-22 DIAGNOSIS — R21 Rash and other nonspecific skin eruption: Secondary | ICD-10-CM

## 2022-02-22 DIAGNOSIS — L988 Other specified disorders of the skin and subcutaneous tissue: Secondary | ICD-10-CM | POA: Diagnosis not present

## 2022-02-22 MED ORDER — CLOBETASOL PROPIONATE 0.05 % EX OINT: TOPICAL_OINTMENT | CUTANEOUS | 0 refills | Status: DC

## 2022-02-22 NOTE — Patient Instructions (Signed)
Due to recent changes in healthcare laws, you may see results of your pathology and/or laboratory studies on MyChart before the doctors have had a chance to review them. We understand that in some cases there may be results that are confusing or concerning to you. Please understand that not all results are received at the same time and often the doctors may need to interpret multiple results in order to provide you with the best plan of care or course of treatment. Therefore, we ask that you please give us 2 business days to thoroughly review all your results before contacting the office for clarification. Should we see a critical lab result, you will be contacted sooner.   If You Need Anything After Your Visit  If you have any questions or concerns for your doctor, please call our main line at [phone] and press option 4 to reach your doctor's medical assistant. If no one answers, please leave a voicemail as directed and we will return your call as soon as possible. Messages left after 4 pm will be answered the following business day.   You may also send us a message via MyChart. We typically respond to MyChart messages within 1-2 business days.  For prescription refills, please ask your pharmacy to contact our office. Our fax number is [phone].  If you have an urgent issue when the clinic is closed that cannot wait until the next business day, you can page your doctor at the number below.    Please note that while we do our best to be available for urgent issues outside of office hours, we are not available 24/7.   If you have an urgent issue and are unable to reach us, you may choose to seek medical care at your doctor's office, retail clinic, urgent care center, or emergency room.  If you have a medical emergency, please immediately call 911 or go to the emergency department.  Pager Numbers  - Dr. Kowalski: [phone]  - Dr. Moye: [phone]  - Dr. Stewart:  [phone]  In the event of inclement weather, please call our main line at [phone] for an update on the status of any delays or closures.  Dermatology Medication Tips: Please keep the boxes that topical medications come in in order to help keep track of the instructions about where and how to use these. Pharmacies typically print the medication instructions only on the boxes and not directly on the medication tubes.   If your medication is too expensive, please contact our office at [phone] option 4 or send us a message through MyChart.   We are unable to tell what your co-pay for medications will be in advance as this is different depending on your insurance coverage. However, we may be able to find a substitute medication at lower cost or fill out paperwork to get insurance to cover a needed medication.   If a prior authorization is required to get your medication covered by your insurance company, please allow us 1-2 business days to complete this process.  Drug prices often vary depending on where the prescription is filled and some pharmacies may offer cheaper prices.  The website www.goodrx.com contains coupons for medications through different pharmacies. The prices here do not account for what the cost may be with help from insurance (it may be cheaper with your insurance), but the website can give you the price if you did not use any insurance.  - You can print the associated coupon and take it with   your prescription to the pharmacy.  - You may also stop by our office during regular business hours and pick up a GoodRx coupon card.  - If you need your prescription sent electronically to a different pharmacy, notify our office through [facility] MyChart or by phone at [phone] option 4.     Si Usted Necesita Algo Despus de Su Visita  Tambin puede enviarnos un mensaje a travs de MyChart. Por lo general respondemos a los mensajes de MyChart en el transcurso de 1 a 2  das hbiles.  Para renovar recetas, por favor pida a su farmacia que se ponga en contacto con nuestra oficina. Nuestro nmero de fax es el [phone].  Si tiene un asunto urgente cuando la clnica est cerrada y que no puede esperar hasta el siguiente da hbil, puede llamar/localizar a su doctor(a) al nmero que aparece a continuacin.   Por favor, tenga en cuenta que aunque hacemos todo lo posible para estar disponibles para asuntos urgentes fuera del horario de oficina, no estamos disponibles las 24 horas del da, los 7 das de la semana.   Si tiene un problema urgente y no puede comunicarse con nosotros, puede optar por buscar atencin mdica  en el consultorio de su doctor(a), en una clnica privada, en un centro de atencin urgente o en una sala de emergencias.  Si tiene una emergencia mdica, por favor llame inmediatamente al 911 o vaya a la sala de emergencias.  Nmeros de bper  - Dr. Kowalski: [phone]  - Dra. Moye: [phone]  - Dra. Stewart: [phone]  En caso de inclemencias del tiempo, por favor llame a nuestra lnea principal al [phone] para una actualizacin sobre el estado de cualquier retraso o cierre.  Consejos para la medicacin en dermatologa: Por favor, guarde las cajas en las que vienen los medicamentos de uso tpico para ayudarle a seguir las instrucciones sobre dnde y cmo usarlos. Las farmacias generalmente imprimen las instrucciones del medicamento slo en las cajas y no directamente en los tubos del medicamento.   Si su medicamento es muy caro, por favor, pngase en contacto con nuestra oficina llamando al [phone] y presione la opcin 4 o envenos un mensaje a travs de MyChart.   No podemos decirle cul ser su copago por los medicamentos por adelantado ya que esto es diferente dependiendo de la cobertura de su seguro. Sin embargo, es posible que podamos encontrar un medicamento sustituto a menor costo o llenar un formulario para que el  seguro cubra el medicamento que se considera necesario.   Si se requiere una autorizacin previa para que su compaa de seguros cubra su medicamento, por favor permtanos de 1 a 2 das hbiles para completar este proceso.  Los precios de los medicamentos varan con frecuencia dependiendo del lugar de dnde se surte la receta y alguna farmacias pueden ofrecer precios ms baratos.  El sitio web www.goodrx.com tiene cupones para medicamentos de diferentes farmacias. Los precios aqu no tienen en cuenta lo que podra costar con la ayuda del seguro (puede ser ms barato con su seguro), pero el sitio web puede darle el precio si no utiliz ningn seguro.  - Puede imprimir el cupn correspondiente y llevarlo con su receta a la farmacia.  - Tambin puede pasar por nuestra oficina durante el horario de atencin regular y recoger una tarjeta de cupones de GoodRx.  - Si necesita que su receta se enve electrnicamente a una farmacia diferente, informe a nuestra oficina a travs de MyChart de [facility]   o por telfono llamando al [phone] y presione la opcin 4.  

## 2022-02-22 NOTE — Progress Notes (Signed)
    Follow-Up Visit   Subjective  Kelly [name] is a 36 y.o. female who presents for the following: Facial Elastosis (Patient here today for Botox injections) and Rash (On the hands that started on 01/16/22 - currently using TMC 0.1% x 2 weeks but she isn't sure how much longer she should use it. She also applies Vaseline to her hands at night and sleeps in gloves. The rash burns and stings and she would like to discuss treatment options today).   The following portions of the chart were reviewed this encounter and updated as appropriate:   Tobacco  Allergies  Meds  Problems  Med Hx  Surg Hx  Fam Hx      Review of Systems:  No other skin or systemic complaints except as noted in HPI or Assessment and Plan.  Objective  Well appearing patient in no apparent distress; mood and affect are within normal limits.  A focused examination was performed including the face and hands. Relevant physical exam findings are noted in the Assessment and Plan.  Face Rhytides and volume loss.      B/L hand Scaly pink plaques    Assessment & Plan  Elastosis of skin Face  Botox Injection - Face Location: See attached image  Informed consent: Discussed risks (infection, pain, bleeding, bruising, swelling, allergic reaction, paralysis of nearby muscles, eyelid droop, double vision, neck weakness, difficulty breathing, headache, undesirable cosmetic result, and need for additional treatment) and benefits of the procedure, as well as the alternatives.  Informed consent was obtained.  Preparation: The area was cleansed with alcohol.  Procedure Details:  Botox was injected into the dermis with a 30-gauge needle. Pressure applied to any bleeding. Ice packs offered for swelling.  Lot Number:  [id number] Expiration:  04/2024  Total Units Injected:  28  Plan: Tylenol may be used for headache.  Allow 2 weeks before returning to clinic for additional dosing as needed. Patient will call for any  problems.   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 Condition is bothersome/symptomatic for patient. Currently flared.  Recommend switching from hand washing to hand sanitizer instead when possible. Recommend moisturizers like Working Hands and Neutrogena hand cream throughout the day and to damp skin after washing hands.   Start Clobetasol 0.05% ointment BID up to two weeks. Topical steroids (such as triamcinolone, fluocinolone, fluocinonide, mometasone, clobetasol, halobetasol, betamethasone, hydrocortisone) can cause thinning and lightening of the skin if they are used for too long in the same area. Your physician has selected the right strength medicine for your problem and area affected on the body. Please use your medication only as directed by your physician to prevent side effects. '  Samples given of Opzelura apply to hands BID PRN.  Rash  Related Medications clobetasol ointment (TEMOVATE) 0.05 % Apply to hands BID up to two weeks. Avoid applying to face, groin, and axilla. Use as directed. Long-term use can cause thinning of the skin.   Return in about 4 months (around 06/24/2022) for Botox injections.  Luther Redo, CMA, am acting as scribe for Forest Gleason, MD .  Documentation: I have reviewed the above documentation for accuracy and completeness, and I agree with the above.  Forest Gleason, MD

## 2022-02-26 ENCOUNTER — Encounter: Payer: Self-pay | Admitting: Dermatology

## 2022-02-28 ENCOUNTER — Other Ambulatory Visit: Payer: Self-pay | Admitting: Obstetrics and Gynecology

## 2022-02-28 ENCOUNTER — Encounter: Payer: Self-pay | Admitting: Obstetrics and Gynecology

## 2022-02-28 DIAGNOSIS — N6320 Unspecified lump in the left breast, unspecified quadrant: Secondary | ICD-10-CM

## 2022-04-26 ENCOUNTER — Ambulatory Visit (INDEPENDENT_AMBULATORY_CARE_PROVIDER_SITE_OTHER): Payer: 59 | Admitting: Dermatology

## 2022-04-26 ENCOUNTER — Encounter: Payer: Self-pay | Admitting: Dermatology

## 2022-04-26 VITALS — BP 114/81 | HR 69

## 2022-04-26 DIAGNOSIS — Z86018 Personal history of other benign neoplasm: Secondary | ICD-10-CM | POA: Diagnosis not present

## 2022-04-26 DIAGNOSIS — D485 Neoplasm of uncertain behavior of skin: Secondary | ICD-10-CM | POA: Diagnosis not present

## 2022-04-26 DIAGNOSIS — A630 Anogenital (venereal) warts: Secondary | ICD-10-CM | POA: Diagnosis not present

## 2022-04-26 DIAGNOSIS — L578 Other skin changes due to chronic exposure to nonionizing radiation: Secondary | ICD-10-CM | POA: Diagnosis not present

## 2022-04-26 DIAGNOSIS — L821 Other seborrheic keratosis: Secondary | ICD-10-CM

## 2022-04-26 DIAGNOSIS — Z1283 Encounter for screening for malignant neoplasm of skin: Secondary | ICD-10-CM | POA: Diagnosis not present

## 2022-04-26 DIAGNOSIS — L309 Dermatitis, unspecified: Secondary | ICD-10-CM | POA: Diagnosis not present

## 2022-04-26 DIAGNOSIS — D229 Melanocytic nevi, unspecified: Secondary | ICD-10-CM

## 2022-04-26 DIAGNOSIS — D492 Neoplasm of unspecified behavior of bone, soft tissue, and skin: Secondary | ICD-10-CM

## 2022-04-26 DIAGNOSIS — L814 Other melanin hyperpigmentation: Secondary | ICD-10-CM

## 2022-04-26 NOTE — Patient Instructions (Addendum)
Area on chest:  Use Clobetasol twice daily up to 2 weeks as needed.   Recommend taking Vitamin D3 600 iu daily.    Cryotherapy Aftercare  Wash gently with soap and water everyday.   Apply Vaseline daily until healed.    Recommend taking Heliocare sun protection supplement daily in sunny weather for additional sun protection. For maximum protection on the sunniest days, you can take up to 2 capsules of regular Heliocare OR take 1 capsule of Heliocare Ultra. For prolonged exposure (such as a full day in the sun), you can repeat your dose of the supplement 4 hours after your first dose. Heliocare can be purchased at [employer], at some [facility] or at [email or web address].     Wound Care Instructions  Cleanse wound gently with soap and water once a day then pat dry with clean gauze. Apply a thin coat of Petrolatum (petroleum jelly, "Vaseline") over the wound (unless you have an allergy to this). We recommend that you use a new, sterile tube of Vaseline. Do not pick or remove scabs. Do not remove the yellow or white "healing tissue" from the base of the wound.  Cover the wound with fresh, clean, nonstick gauze and secure with paper tape. You may use Band-Aids in place of gauze and tape if the wound is small enough, but would recommend trimming much of the tape off as there is often too much. Sometimes Band-Aids can irritate the skin.  You should call the office for your biopsy report after 1 week if you have not already been contacted.  If you experience any problems, such as abnormal amounts of bleeding, swelling, significant bruising, significant pain, or evidence of infection, please call the office immediately.  FOR ADULT SURGERY PATIENTS: If you need something for pain relief you may take 1 extra strength Tylenol (acetaminophen) AND 2 Ibuprofen ('200mg'$  each) together every 4 hours as needed for pain. (do not take these if you are allergic to them or if you have a reason you should not  take them.) Typically, you may only need pain medication for 1 to 3 days.        Recommend daily broad spectrum sunscreen SPF 30+ to sun-exposed areas, reapply every 2 hours as needed. Call for new or changing lesions.  Staying in the shade or wearing long sleeves, sun glasses (UVA+UVB protection) and wide brim hats (4-inch brim around the entire circumference of the hat) are also recommended for sun protection.    Melanoma ABCDEs  Melanoma is the most dangerous type of skin cancer, and is the leading cause of death from skin disease.  You are more likely to develop melanoma if you: Have light-colored skin, light-colored eyes, or red or blond hair Spend a lot of time in the sun Tan regularly, either outdoors or in a tanning bed Have had blistering sunburns, especially during childhood Have a close family member who has had a melanoma Have atypical moles or large birthmarks  Early detection of melanoma is key since treatment is typically straightforward and cure rates are extremely high if we catch it early.   The first sign of melanoma is often a change in a mole or a new dark spot.  The ABCDE system is a way of remembering the signs of melanoma.  A for asymmetry:  The two halves do not match. B for border:  The edges of the growth are irregular. C for color:  A mixture of colors are present instead of an even brown  color. D for diameter:  Melanomas are usually (but not always) greater than 21m - the size of a pencil eraser. E for evolution:  The spot keeps changing in size, shape, and color.  Please check your skin once per month between visits. You can use a small mirror in front and a large mirror behind you to keep an eye on the back side or your body.   If you see any new or changing lesions before your next follow-up, please call to schedule a visit.  Please continue daily skin protection including broad spectrum sunscreen SPF 30+ to sun-exposed areas, reapplying every 2  hours as needed when you're outdoors.   Staying in the shade or wearing long sleeves, sun glasses (UVA+UVB protection) and wide brim hats (4-inch brim around the entire circumference of the hat) are also recommended for sun protection.     Due to recent changes in healthcare laws, you may see results of your pathology and/or laboratory studies on MyChart before the doctors have had a chance to review them. We understand that in some cases there may be results that are confusing or concerning to you. Please understand that not all results are received at the same time and often the doctors may need to interpret multiple results in order to provide you with the best plan of care or course of treatment. Therefore, we ask that you please give u[place]2 business days to thoroughly review all your results before contacting the office for clarification. Should we see a critical lab result, you will be contacted sooner.   If You Need Anything After Your Visit  If you have any questions or concerns for your doctor, please call our main line at [phone] press option 4 to reach your doctor's medical assistant. If no one answers, please leave a voicemail as directed and we will return your call as soon as possible. Messages left after 4 pm will be answered the following business day.   You may also send [place] message via [facility] We typically respond to MyChart messages within 1-2 business days.  For prescription refills, please ask your pharmacy to contact our office. Our fax number is [phone]  If you have an urgent issue when the clinic is closed that cannot wait until the next business day, you can page your doctor at the number below.    Please note that while we do our best to be available for urgent issues outside of office hours, we are not available 24/7.   If you have an urgent issue and are unable to reach u[place] you may choose to seek medical care at your doctor's office, retail clinic,  urgent care center, or emergency room.  If you have a medical emergency, please immediately call 911 or go to the emergency department.  Pager Numbers  - Dr. KNehemiah Massed [phone] - Dr. MLaurence Ferrari [phone] - Dr. SNicole Kindred [phone] In the event of inclement weather, please call our main line at [phone] an update on the status of any delays or closures.  Dermatology Medication Tips: Please keep the boxes that topical medications come in in order to help keep track of the instructions about where and how to use these. Pharmacies typically print the medication instructions only on the boxes and not directly on the medication tubes.   If your medication is too expensive, please contact our office at [phone] 4 or send u[place] message through [facility]   We are unable to  tell what your co-pay for medications will be in advance as this is different depending on your insurance coverage. However, we may be able to find a substitute medication at lower cost or fill out paperwork to get insurance to cover a needed medication.   If a prior authorization is required to get your medication covered by your insurance company, please allow [place] 1-2 business days to complete this process.  Drug prices often vary depending on where the prescription is filled and some pharmacies may offer cheaper prices.  The website www.goodrx.com contains coupons for medications through different pharmacies. The prices here do not account for what the cost may be with help from insurance (it may be cheaper with your insurance), but the website can give you the price if you did not use any insurance.  - You can print the associated coupon and take it with your prescription to the pharmacy.  - You may also stop by our office during regular business hours and pick up a GoodRx coupon card.  - If you need your prescription sent electronically to a different pharmacy, notify our office through [facility] or by phone at [phone] option 4.     Si Usted Necesita Algo Despus de Su Visita  Tambin puede enviarnos un mensaje a travs de Pharmacist, community. Por lo general respondemos a los mensajes de MyChart en el transcurso de 1 a 2 das hbiles.  Para renovar recetas, por favor pida a su farmacia que se ponga en contacto con nuestra oficina. Harland Dingwall de fax es [place] [phone].  Si tiene un asunto urgente cuando la clnica est cerrada y que no puede esperar hasta el siguiente da hbil, puede llamar/localizar a su doctor(a) al nmero que aparece a continuacin.   Por favor, tenga en cuenta que aunque hacemos todo lo posible para estar disponibles para asuntos urgentes fuera del horario de [place], no estamos disponibles las 24 horas del da, los 7 das de la [place].   Si tiene un problema urgente y no puede comunicarse con nosotros, puede optar por buscar atencin mdica  en el consultorio de su doctor(a), en una clnica privada, en un centro de atencin urgente o en una sala de emergencias.  Si tiene Engineering geologist, por favor llame inmediatamente al 911 o vaya a la sala de emergencias.  Nmeros de bper  - Dr. Nehemiah Massed: [phone]  - Dra. Moye: [phone]  - Dra. Nicole Kindred: [phone]  En caso de inclemencias del [place], por favor llame a Johnsie Kindred principal al [phone] para una actualizacin sobre el [place] de cualquier retraso o cierre.  Consejos para la medicacin en dermatologa: Por favor, guarde las cajas en las que vienen los medicamentos de uso tpico para ayudarle a seguir las instrucciones sobre dnde y cmo usarlos. Las farmacias generalmente imprimen las instrucciones del medicamento slo en las cajas y no directamente en los tubos del [place].   Si su medicamento es muy caro, por favor, pngase en contacto con Zigmund Daniel llamando al [phone] y presione la opcin 4 o envenos un mensaje a travs de Pharmacist, community.   No podemos decirle cul  ser su copago por los medicamentos por adelantado ya que esto es diferente dependiendo de la cobertura de su seguro. Sin embargo, es posible que podamos encontrar un medicamento sustituto a Electrical engineer un formulario para que el seguro cubra el medicamento que se considera necesario.   Si se requiere una autorizacin previa para que su compaa de seguros [place]  su medicamento, por favor permtanos de 1 a 2 das hbiles para completar este proceso.  Los precios de los medicamentos varan con frecuencia dependiendo del Environmental consultant de dnde se surte la receta y alguna farmacias pueden ofrecer precios ms baratos.  El sitio web www.goodrx.com tiene cupones para medicamentos de Airline pilot. Los precios aqu no tienen en cuenta lo que podra costar con la ayuda del seguro (puede ser ms barato con su seguro), pero el sitio web puede darle el precio si no utiliz Research scientist (physical sciences).  - Puede imprimir el cupn correspondiente y llevarlo con su receta a la farmacia.  - Tambin puede pasar por nuestra oficina durante el horario de atencin regular y Charity fundraiser una tarjeta de cupones de GoodRx.  - Si necesita que su receta se enve electrnicamente a una farmacia diferente, informe a nuestra oficina a travs de MyChart de [place] o por telfono llamando al [phone] y presione la opcin 4.

## 2022-04-26 NOTE — Progress Notes (Signed)
Follow-Up Visit   Subjective  Kelly [name] is a 37 y.o. female who presents for the following: Annual Exam (Hx of dysplastic nevus with severe atypia. Noticed pink spot on chest 1-2 weeks. Itches when touched).  The patient presents for Total-Body Skin Exam (TBSE) for skin cancer screening and mole check.  The patient has spots, moles and lesions to be evaluated, some may be new or changing and the patient has concerns that these could be cancer.  The following portions of the chart were reviewed this encounter and updated as appropriate:  Tobacco  Allergies  Meds  Problems  Med Hx  Surg Hx  Fam Hx      Review of Systems: No other skin or systemic complaints except as noted in HPI or Assessment and Plan.   Objective  Well appearing patient in no apparent distress; mood and affect are within normal limits.  A full examination was performed including scalp, head, eyes, ears, nose, lips, neck, chest, axillae, abdomen, back, buttocks, bilateral upper extremities, bilateral lower extremities, hands, feet, fingers, toes, fingernails, and toenails. All findings within normal limits unless otherwise noted below.  Left Chest Scaly erythematous papules and plaques +/- edema and vesiculation.   Left Helix 1.0 cm pink plaque     left medial glute x1 Verrucous papules -- Discussed viral etiology and contagion.    Assessment & Plan   History of Dysplastic Nevus. Left inferior breast, severe atypia. 05/2020. - No evidence of recurrence today - Recommend regular full body skin exams - Recommend daily broad spectrum sunscreen SPF 30+ to sun-exposed areas, reapply every 2 hours as needed.  - Call if any new or changing lesions are noted between office visits  Lentigines - Scattered tan macules - Due to sun exposure - Benign-appearing, observe - Recommend daily broad spectrum sunscreen SPF 30+ to sun-exposed areas, reapply every 2 hours as needed. - Call for any  changes  Seborrheic Keratoses - Stuck-on, waxy, tan-brown papules and/or plaques  - Benign-appearing - Discussed benign etiology and prognosis. - Observe - Call for any changes  Melanocytic Nevi - Tan-brown and/or pink-flesh-colored symmetric macules and papules - Benign appearing on exam today - Observation - Call clinic for new or changing moles - Recommend daily use of broad spectrum spf 30+ sunscreen to sun-exposed areas.   Hemangiomas - Red papules - Discussed benign nature - Observe - Call for any changes  Actinic Damage - Chronic condition, secondary to cumulative UV/sun exposure - diffuse scaly erythematous macules with underlying dyspigmentation - Recommend daily broad spectrum sunscreen SPF 30+ to sun-exposed areas, reapply every 2 hours as needed.  - Staying in the shade or wearing long sleeves, sun glasses (UVA+UVB protection) and wide brim hats (4-inch brim around the entire circumference of the hat) are also recommended for sun protection.  - Call for new or changing lesions.  Skin cancer screening performed today.  Dermatitis Left Chest  Use Clobetasol twice daily up to 2 weeks as needed for up to 2 weeks. Avoid applying to face, groin, and axilla. Use as directed. Long-term use can cause thinning of the skin.  Topical steroids (such as triamcinolone, fluocinolone, fluocinonide, mometasone, clobetasol, halobetasol, betamethasone, hydrocortisone) can cause thinning and lightening of the skin if they are used for too long in the same area. Your physician has selected the right strength medicine for your problem and area affected on the body. Please use your medication only as directed by your physician to prevent side effects.  Neoplasm of skin Left Helix  Skin / nail biopsy Type of biopsy: tangential   Informed consent: discussed and consent obtained   Anesthesia: the lesion was anesthetized in a standard fashion   Anesthesia comment:  Area prepped with  alcohol Anesthetic:  1% lidocaine w/ epinephrine 1-100,000 buffered w/ 8.4% NaHCO3 Instrument used: flexible razor blade   Hemostasis achieved with: pressure, aluminum chloride and electrodesiccation   Outcome: patient tolerated procedure well   Post-procedure details: wound care instructions given   Post-procedure details comment:  Ointment and small bandage applied  Specimen 1 - Surgical pathology Differential Diagnosis: R/O BCC  Check Margins: No  Plan Mohs surgery if + skin cancer  Condyloma left medial glute x1  Viral Wart (HPV) Counseling  Discussed viral / HPV (Human Papilloma Virus) etiology and risk of spread /infectivity to other areas of body as well as to other people.  Multiple treatments and methods may be required to clear warts and it is possible treatment may not be successful.  Treatment risks include discoloration; scarring and there is still potential for wart recurrence.   Recheck at appt for Botox  Destruction of lesion - left medial glute x1  Destruction method: cryotherapy   Informed consent: discussed and consent obtained   Lesion destroyed using liquid nitrogen: Yes   Region frozen until ice ball extended beyond lesion: Yes   Outcome: patient tolerated procedure well with no complications   Post-procedure details: wound care instructions given   Additional details:  Prior to procedure, discussed risks of blister formation, small wound, skin dyspigmentation, or rare scar following cryotherapy. Recommend Vaseline ointment to treated areas while healing.    Return in about 1 year (around 04/27/2023) for TBSE, HxDN, Botox Follow Up/recheck wart.  I, Emelia Salisbury, CMA, am acting as scribe for Forest Gleason, MD.  Documentation: I have reviewed the above documentation for accuracy and completeness, and I agree with the above.  Forest Gleason, MD

## 2022-05-01 NOTE — Progress Notes (Unsigned)
New Patient Note  RE: Kelly [name] MRN: [id number] DOB: 09/23/1985 Date of Office Visit: 05/02/2022  Consult requested by: Mckinley Jewel, MD Primary care provider: Patient, No Pcp Per  Chief Complaint: No chief complaint on file.  History of Present Illness: I had the pleasure of seeing Kelly [name] for initial evaluation at the [facility] on 05/01/2022. She is a 37 y.o. female, who is referred here by Patient, No Pcp Per for the evaluation of hives.  Rash started about *** ago. Mainly occurs on her ***. Describes them as ***. Individual rashes lasts about ***. No ecchymosis upon resolution. Associated symptoms include: ***.  Frequency of episodes: ***. Suspected triggers are ***. Denies any *** fevers, chills, changes in medications, foods, personal care products or recent infections. She has tried the following therapies: *** with *** benefit. Systemic steroids ***. Currently on ***.  Previous work up includes: ***. Previous history of rash/hives: ***. Patient is up to date with the following cancer screening tests: ***.  Assessment and Plan: Kelly [name] is a 37 y.o. female with: No problem-specific Assessment & Plan notes found for this encounter.  No follow-ups on file.  No orders of the defined types were placed in this encounter.  Lab Orders  No laboratory test(s) ordered today    Other allergy screening: Asthma: {Blank single:19197::"yes","no"} Rhino conjunctivitis: {Blank single:19197::"yes","no"} Food allergy: {Blank single:19197::"yes","no"} Medication allergy: {Blank single:19197::"yes","no"} Hymenoptera allergy: {Blank single:19197::"yes","no"} Urticaria: {Blank single:19197::"yes","no"} Eczema:{Blank single:19197::"yes","no"} History of recurrent infections suggestive of immunodeficency: {Blank single:19197::"yes","no"}  Diagnostics: Spirometry:  Tracings reviewed. Her effort: {Blank single:19197::"Good reproducible  efforts.","It was hard to get consistent efforts and there is a question as to whether this reflects a maximal maneuver.","Poor effort, data can not be interpreted."} FVC: ***L FEV1: ***L, ***% predicted FEV1/FVC ratio: ***% Interpretation: {Blank single:19197::"Spirometry consistent with mild obstructive disease","Spirometry consistent with moderate obstructive disease","Spirometry consistent with severe obstructive disease","Spirometry consistent with possible restrictive disease","Spirometry consistent with mixed obstructive and restrictive disease","Spirometry uninterpretable due to technique","Spirometry consistent with normal pattern","No overt abnormalities noted given today's efforts"}.  Please see scanned spirometry results for details.  Skin Testing: {Blank single:19197::"Select foods","Environmental allergy panel","Environmental allergy panel and select foods","Food allergy panel","None","Deferred due to recent antihistamines use"}. *** Results discussed with patient/family.   Past Medical History: Patient Active Problem List   Diagnosis Date Noted   Abdominal pain 06/18/2018   Preterm labor without delivery, third trimester 03/26/2016   Preterm uterine contractions in third trimester, antepartum 02/01/2016   Postpartum state 10/26/2014   Normal labor and delivery 10/25/2014   [redacted] weeks gestation of pregnancy    Preterm labor in third trimester without delivery 09/14/2014   Past Medical History:  Diagnosis Date   Dysplastic nevus 03/31/2020   left inferior breast, severe atypia / excised 06/01/20   Past Surgical History: Past Surgical History:  Procedure Laterality Date   AUGMENTATION MAMMAPLASTY Bilateral 2006   BREAST SURGERY     WISDOM TOOTH EXTRACTION Bilateral    Medication List:  Current Outpatient Medications  Medication Sig Dispense Refill   cetirizine (ZYRTEC) 10 MG tablet Take 10 mg by mouth at bedtime. (Patient not taking: Reported on 03/31/2020)      clobetasol ointment (TEMOVATE) 0.05 % Apply to hands BID up to two weeks. Avoid applying to face, groin, and axilla. Use as directed. Long-term use can cause thinning of the skin. 45 g 0   colestipol (COLESTID) 1 g tablet Take 1 tablet (1 g total) by mouth 2 (two) times daily. (  Patient not taking: Reported on 03/31/2020) 60 tablet 0   FLUoxetine (PROZAC) 20 MG capsule Take 20 mg by mouth at bedtime. (Patient not taking: Reported on 99991111)     folic acid (FOLVITE) Q000111Q MCG tablet Take 800 mcg by mouth at bedtime.  (Patient not taking: Reported on 03/31/2020)     ketoconazole (NIZORAL) 2 % shampoo apply three times per week, massage into scalp and leave in for 10 minutes before rinsing out 120 mL 5   metoprolol tartrate (LOPRESSOR) 25 MG tablet Take by mouth.     mupirocin ointment (BACTROBAN) 2 % Apply 1 application topically 3 (three) times daily. 22 g 0   Prenatal Vit-Fe Fumarate-FA (PRENATAL MULTIVITAMIN) TABS tablet Take 1 tablet by mouth at bedtime.  (Patient not taking: Reported on 03/31/2020)     sertraline (ZOLOFT) 50 MG tablet Take 50 mg by mouth at bedtime. (Patient not taking: Reported on 03/31/2020)     No current facility-administered medications for this visit.   Allergies: No Known Allergies Social History: Social History   Socioeconomic History   Marital status: Married    Spouse name: Not on file   Number of children: Not on file   Years of education: Not on file   Highest education level: Not on file  Occupational History   Not on file  Tobacco Use   Smoking status: Never   Smokeless tobacco: Never  Substance and Sexual Activity   Alcohol use: No   Drug use: No   Sexual activity: Yes    Birth control/protection: None  Other Topics Concern   Not on file  Social History Narrative   Not on file   Social Determinants of Health   Financial Resource Strain: Not on file  Food Insecurity: Not on file  Transportation Needs: Not on file  Physical Activity: Not on file   Stress: Not on file  Social Connections: Not on file   Lives in a ***. Smoking: *** Occupation: ***  Environmental HistoryFreight forwarder in the house: Estate agent in the family room: {Blank single:19197::"yes","no"} Carpet in the bedroom: {Blank single:19197::"yes","no"} Heating: {Blank single:19197::"electric","gas","heat pump"} Cooling: {Blank single:19197::"central","window","heat pump"} Pet: {Blank single:19197::"yes ***","no"}  Family History: Family History  Problem Relation Age of Onset   Hyperlipidemia Maternal Grandmother    Breast cancer Paternal Grandmother        16   Diabetes Paternal Grandmother    Problem                               Relation Asthma                                   *** Eczema                                *** Food allergy                          *** Allergic rhino conjunctivitis     ***  Review of Systems  Constitutional:  Negative for appetite change, chills, fever and unexpected weight change.  HENT:  Negative for congestion and rhinorrhea.   Eyes:  Negative for itching.  Respiratory:  Negative for cough, chest tightness, shortness of breath and wheezing.   Cardiovascular:  Negative  for chest pain.  Gastrointestinal:  Negative for abdominal pain.  Genitourinary:  Negative for difficulty urinating.  Skin:  Negative for rash.  Neurological:  Negative for headaches.    Objective: There were no vitals taken for this visit. There is no height or weight on file to calculate BMI. Physical Exam Vitals and nursing note reviewed.  Constitutional:      Appearance: Normal appearance. She is well-developed.  HENT:     Head: Normocephalic and atraumatic.     Right Ear: Tympanic membrane and external ear normal.     Left Ear: Tympanic membrane and external ear normal.     Nose: Nose normal.     Mouth/Throat:     Mouth: Mucous membranes are moist.     Pharynx: Oropharynx is clear.  Eyes:      Conjunctiva/sclera: Conjunctivae normal.  Cardiovascular:     Rate and Rhythm: Normal rate and regular rhythm.     Heart sounds: Normal heart sounds. No murmur heard.    No friction rub. No gallop.  Pulmonary:     Effort: Pulmonary effort is normal.     Breath sounds: Normal breath sounds. No wheezing, rhonchi or rales.  Musculoskeletal:     Cervical back: Neck supple.  Skin:    General: Skin is warm.     Findings: No rash.  Neurological:     Mental Status: She is alert and oriented to person, place, and time.  Psychiatric:        Behavior: Behavior normal.    The plan was reviewed with the patient/family, and all questions/concerned were addressed.  It was my pleasure to see Kelly [name] today and participate in her care. Please feel free to contact me with any questions or concerns.  Sincerely,  Rexene Alberts, DO Allergy & Immunology  [facility] office: [place] office: [phone]

## 2022-05-02 ENCOUNTER — Ambulatory Visit (INDEPENDENT_AMBULATORY_CARE_PROVIDER_SITE_OTHER): Payer: 59 | Admitting: Allergy

## 2022-05-02 ENCOUNTER — Encounter: Payer: Self-pay | Admitting: Allergy

## 2022-05-02 ENCOUNTER — Other Ambulatory Visit: Payer: Self-pay

## 2022-05-02 VITALS — BP 116/82 | HR 67 | Temp 98.1°F | Resp 14 | Ht 64.0 in | Wt 155.0 lb

## 2022-05-02 DIAGNOSIS — L509 Urticaria, unspecified: Secondary | ICD-10-CM | POA: Diagnosis not present

## 2022-05-02 DIAGNOSIS — J3089 Other allergic rhinitis: Secondary | ICD-10-CM

## 2022-05-02 DIAGNOSIS — R062 Wheezing: Secondary | ICD-10-CM | POA: Insufficient documentation

## 2022-05-02 DIAGNOSIS — L299 Pruritus, unspecified: Secondary | ICD-10-CM

## 2022-05-02 DIAGNOSIS — J302 Other seasonal allergic rhinitis: Secondary | ICD-10-CM | POA: Insufficient documentation

## 2022-05-02 DIAGNOSIS — L2089 Other atopic dermatitis: Secondary | ICD-10-CM | POA: Diagnosis not present

## 2022-05-02 DIAGNOSIS — L2084 Intrinsic (allergic) eczema: Secondary | ICD-10-CM | POA: Insufficient documentation

## 2022-05-02 NOTE — Assessment & Plan Note (Signed)
See below for proper skin care.

## 2022-05-02 NOTE — Assessment & Plan Note (Signed)
Wheezing at times. Used to take albuterol during bronchitis prn but it caused palpitations in the past. Some reflux symptoms.  Get spirometry at next visit. Monitor symptoms. Declined rescue inhaler as symptoms not bothersome enough today.  Recommend xopenex rescue inhaler due to palpitations with albuterol.

## 2022-05-02 NOTE — Assessment & Plan Note (Signed)
Started to break out 1+ year ago every few days with no known triggers. Takes zyrtec prn which helps and hives resolve within 30 min. Concerned about chicken/poultry but sometimes tolerates with no issues. 2024 bloodwork CMP and TSH normal. Based on clinical history, she likely has chronic idiopathic urticaria. Discussed with patient, that urticaria is usually caused by release of histamine by cutaneous mast cells but sometimes it is non-histamine mediated. Explained that urticaria is not always associated with allergies. In most cases, the exact etiology for urticaria can not be established and it is considered idiopathic. Start zyrtec (cetirizine) 60m or allegra 1865monce a day and may take twice a day if needed.  If symptoms are not controlled or causes drowsiness let usKoreanow. Avoid the following potential triggers: alcohol, tight clothing, NSAIDs, hot showers and getting overheated. Get bloodwork to rule out other etiologies. Skin testing to poultry and chicken can be done at next visit just to rule it out.

## 2022-05-02 NOTE — Assessment & Plan Note (Signed)
Perennial PND symptoms for many years. Using Flonase prn with unknown benefit. No prior allergy/ENT evaluation. 1 dog at home. Unable to skin test today as [facility] won't allow new patient visits and procedures on the same day.  Return for skin testing (1-59) Take antihistamine daily as above. If no improvement will do a trial of either azelastine or Atrovent nasal spray next.

## 2022-05-02 NOTE — Patient Instructions (Addendum)
Unable to skin test today as [facility] won't allow new patient visits and procedures on the same day.   Hives: Based on clinical history, she likely has chronic idiopathic urticaria. Discussed with patient, that urticaria is usually caused by release of histamine by cutaneous mast cells but sometimes it is non-histamine mediated. Explained that urticaria is not always associated with allergies. In most cases, the exact etiology for urticaria can not be established and it is considered idiopathic.  Start zyrtec (cetirizine) 76m or allegra 1874monce a day and may take twice a day if needed.  If symptoms are not controlled or causes drowsiness let usKoreanow. Avoid the following potential triggers: alcohol, tight clothing, NSAIDs, hot showers and getting overheated. Get bloodwork:  We are ordering labs, so please allow 1-2 weeks for the results to come back. With the newly implemented Cures Act, the labs might be visible to you at the same time that they become visible to me. However, I will not address the results until all of the results are back, so please be patient.    Rhinitis: Return for skin testing. Take antihistamine daily as above.  Breathing Will do breathing test at next visit. Monitor symptoms.  Eczema See below for proper skin care.  Follow up for skin testing.   Skin care recommendations  Bath time: Always use lukewarm water. AVOID very hot or cold water. Keep bathing time to 5-10 minutes. Do NOT use bubble bath. Use a mild soap and use just enough to wash the dirty areas. Do NOT scrub skin vigorously.  After bathing, pat dry your skin with a towel. Do NOT rub or scrub the skin.  Moisturizers and prescriptions:  ALWAYS apply moisturizers immediately after bathing (within 3 minutes). This helps to lock-in moisture. Use the moisturizer several times a day over the whole body. Good summer moisturizers include: Aveeno, CeraVe, Cetaphil. Good winter moisturizers include:  Aquaphor, Vaseline, Cerave, Cetaphil, Eucerin, Vanicream. When using moisturizers along with medications, the moisturizer should be applied about one hour after applying the medication to prevent diluting effect of the medication or moisturize around where you applied the medications. When not using medications, the moisturizer can be continued twice daily as maintenance.  Laundry and clothing: Avoid laundry products with added color or perfumes. Use unscented hypo-allergenic laundry products such as Tide free, Cheer free & gentle, and All free and clear.  If the skin still seems dry or sensitive, you can try double-rinsing the clothes. Avoid tight or scratchy clothing such as wool. Do not use fabric softeners or dyer sheets.

## 2022-05-03 ENCOUNTER — Telehealth: Payer: Self-pay

## 2022-05-03 NOTE — Telephone Encounter (Signed)
Discussed pathology results. Patient voiced understanding.

## 2022-05-03 NOTE — Telephone Encounter (Signed)
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   Based on pathology and exam, this is most consistent with granuloma annulare, an benign (non-cancerous) inflammatory condition. No treatment needed if not bothersome. Will recheck at follow-up.   MAs please call. Thank you!

## 2022-05-08 ENCOUNTER — Encounter: Payer: Self-pay | Admitting: Dermatology

## 2022-05-11 LAB — SEDIMENTATION RATE: Sed Rate: 6 mm/hr (ref 0–32)

## 2022-05-11 LAB — ALPHA-GAL PANEL
Allergen Lamb IgE: <0.1 kU/L
Beef IgE: <0.1 kU/L
IgE (Immunoglobulin E), Serum: 151 IU/mL (ref 6–495)
O215-IgE Alpha-Gal: <0.1 kU/L
Pork IgE: <0.1 kU/L

## 2022-05-11 LAB — CBC WITH DIFFERENTIAL/PLATELET
Basophils Absolute: 0.1 10*3/uL (ref 0.0–0.2)
Basos: 1 %
EOS (ABSOLUTE): 0.1 10*3/uL (ref 0.0–0.4)
Eos: 1 %
Hematocrit: 42.5 % (ref 34.0–46.6)
Hemoglobin: 14.1 g/dL (ref 11.1–15.9)
Immature Grans (Abs): 0 10*3/uL (ref 0.0–0.1)
Immature Granulocytes: 0 %
Lymphocytes Absolute: 1.7 10*3/uL (ref 0.7–3.1)
Lymphs: 34 %
MCH: 32 pg (ref 26.6–33.0)
MCHC: 33.2 g/dL (ref 31.5–35.7)
MCV: 97 fL (ref 79–97)
Monocytes Absolute: 0.5 10*3/uL (ref 0.1–0.9)
Monocytes: 10 %
Neutrophils Absolute: 2.8 10*3/uL (ref 1.4–7.0)
Neutrophils: 54 %
Platelets: 305 10*3/uL (ref 150–450)
RBC: 4.4 x10E6/uL (ref 3.77–5.28)
RDW: 11.9 % (ref 11.7–15.4)
WBC: 5.2 10*3/uL (ref 3.4–10.8)

## 2022-05-11 LAB — C-REACTIVE PROTEIN: CRP: 1 mg/L (ref 0–10)

## 2022-05-11 LAB — C3 AND C4
Complement C3, Serum: 128 mg/dL (ref 82–167)
Complement C4, Serum: 24 mg/dL (ref 12–38)

## 2022-05-11 LAB — TRYPTASE: Tryptase: 3.6 ug/L (ref 2.2–13.2)

## 2022-05-11 LAB — CHRONIC URTICARIA: cu index: 10.2 — ABNORMAL HIGH (ref <10)

## 2022-05-11 LAB — ANA W/REFLEX: Anti Nuclear Antibody (ANA): NEGATIVE

## 2022-05-17 NOTE — Patient Instructions (Addendum)
Wheezing Begin montelukast 10 mg once a day to prevent cough or wheeze. Patient cautioned that rarely some children/adults can experience behavioral changes after beginning montelukast. These side effects are rare, however, if you notice any change, notify the clinic and discontinue montelukast. Continue Xopenex 2 puffs once every 4-6 hours as needed for cough or wheeze  Allergic rhinitis Skin testing deferred due to current hive breakout. A lab order has been placed to help [place] evaluate and treat your environmental allergies. We will call you when the results become available Continue cetirizine 10 mg once a day as needed for a runny nose Begin Flonase or Nasacort 2 sprays in each nostril once a day as needed for a stuffy nose.  In the right nostril, point the applicator out toward the right ear. In the left nostril, point the applicator out toward the left ear Consider saline nasal rinses as needed for nasal symptoms. Use this before any medicated nasal sprays for best result Consider nasal saline gel as needed for dry nostrils  Hives (urticaria) Use the least amount of medications while remaining hive free Cetirizine (Zyrtec) '10mg'$  twice a day and famotidine (Pepcid) 20 mg twice a day. If no symptoms for 7-14 days then decrease to. Cetirizine (Zyrtec) '10mg'$  twice a day and famotidine (Pepcid) 20 mg once a day.  If no symptoms for 7-14 days then decrease to. Cetirizine (Zyrtec) '10mg'$  twice a day.  If no symptoms for 7-14 days then decrease to. Cetirizine (Zyrtec) '10mg'$  once a day. May use Benadryl (diphenhydramine) as needed for breakthrough hives       If symptoms return, then step up dosage Keep a detailed symptom journal including foods eaten, contact with allergens, medications taken, weather changes.   Atopic dermatitis Continue a twice a day moisturizing routine Try Eucrisa twice a day as needed to red and itchy areas Call the clinic if this treatment plan is not working well for  you.  Follow up in 3 months or sooner if needed.

## 2022-05-17 NOTE — Progress Notes (Signed)
[place] [id number] Dept: [phone]  FOLLOW UP NOTE  Patient ID: Kelly [name], female    DOB: [birth year]  Age: 37 y.o. MRN: [id number] Date of Office Visit: 05/18/2022  Assessment  Chief Complaint: Allergy Testing (No concerns)  HPI Kelly [name] is a 37 year old female who presents to the clinic for a follow-up visit.  She was last seen in this clinic on 05/02/2022 by Dr. Maudie Mercury for evaluation of urticaria, rhinitis, asthma, and atopic dermatitis.  She returns to the clinic for allergy skin testing as her insurance does not allow this testing on the initial visit.  At today's visit, she reports that her breathing is moderately well controlled with occasional wheeze and cough producing clear mucus. She denies shortness of breath. She reports the wheeze diminishes after coughing. She has a Xopenex inhaler, however, has not used this. She reports that she has used albuterol previously, however, this medication made her jittery.   Allergic rhinitis is reported as moderately well controlled with nasal congestion and post nasal drainage as the main symptoms. She reports frequent dry nostrils. She occasionally uses Flonase and a saline rinse.   Urticaria is reported as moderately well controlled with cetirizine every 1-3 days. She reports that if she goes several days without cetirizine she begins to feel itch and break out in hives. She also notes the hives are more prominent with pressure. She denies cardiopulmonary or gastrointestinal symptoms with these hives. Hives generally resolve within about 20-30 minutes. We briefly discussed Xolair as an option for difficult to treat hives.   Atopic dermatitis is reported as moderately well controlled with occasional red and itchy areas occurring on bilateral hands. She reports this is worse in the winter when the weather is old. She does currently see a dermatologist. She has not previously tried [place].   Her current  medications are listed in the chart.   Drug Allergies:  No Known Allergies  Physical Exam: BP 110/76 (BP Location: Left Arm)   Pulse 72   Temp 97.9 F (36.6 C)   Resp 16   Ht '5\' 3"'$  (1.6 m)   Wt 152 lb 12.8 oz (69.3 kg)   SpO2 100%   BMI 27.07 kg/m    Physical Exam Vitals reviewed.  Constitutional:      Appearance: Normal appearance.  HENT:     Head: Normocephalic and atraumatic.     Right Ear: Tympanic membrane normal.     Left Ear: Tympanic membrane normal.     Nose:     Comments: Bilateral nares slightly erythematous with clear nasal drainage noted. Pharynx normal. Ears normal. Eyes normal.     Mouth/Throat:     Pharynx: Oropharynx is clear.  Eyes:     Conjunctiva/sclera: Conjunctivae normal.  Cardiovascular:     Rate and Rhythm: Normal rate and regular rhythm.  Pulmonary:     Effort: Pulmonary effort is normal.     Breath sounds: Normal breath sounds.  Musculoskeletal:        General: Normal range of motion.     Cervical back: Normal range of motion and neck supple.  Skin:    General: Skin is warm and dry.     Comments: Hives noted left antecubital fosse where the blood pressure cuff had been a few minutes earlier. Bilateral hands with scattered eczematous areas. No open areas or drainage noted.   Neurological:     Mental Status: She is alert and oriented to person, place, and time.  Psychiatric:  Mood and Affect: Mood normal.        Behavior: Behavior normal.        Thought Content: Thought content normal.        Judgment: Judgment normal.     Diagnostics: FVC 3.02 which is 84% of the predicted value, FEV1 1.95 which is 66% of the predicted value. Spirometry indicates obstruction. Post bronchodilator spirometry indicates 5% improvement in FEV1.    Assessment and Plan: 1. Urticaria   2. Pruritus   3. Intrinsic atopic dermatitis   4. Wheezing   5. Other allergic rhinitis     Meds ordered this encounter  Medications   montelukast (SINGULAIR) 10  MG tablet    Sig: Take 1 tablet (10 mg total) by mouth at bedtime.    Dispense:  30 tablet    Refill:  5   famotidine (PEPCID) 20 MG tablet    Sig: Take 1 tablet (20 mg total) by mouth 2 (two) times daily.    Dispense:  60 tablet    Refill:  2   cetirizine (ZYRTEC) 10 MG tablet    Sig: Take 1 tablet (10 mg total) by mouth 2 (two) times daily as needed for allergies.    Dispense:  60 tablet    Refill:  2    Patient Instructions  Wheezing Begin montelukast 10 mg once a day to prevent cough or wheeze. Patient cautioned that rarely some children/adults can experience behavioral changes after beginning montelukast. These side effects are rare, however, if you notice any change, notify the clinic and discontinue montelukast. Continue Xopenex 2 puffs once every 4-6 hours as needed for cough or wheeze  Allergic rhinitis Skin testing deferred due to current hive breakout. A lab order has been placed to help [place] evaluate and treat your environmental allergies. We will call you when the results become available Continue cetirizine 10 mg once a day as needed for a runny nose Begin Flonase or Nasacort 2 sprays in each nostril once a day as needed for a stuffy nose.  In the right nostril, point the applicator out toward the right ear. In the left nostril, point the applicator out toward the left ear Consider saline nasal rinses as needed for nasal symptoms. Use this before any medicated nasal sprays for best result Consider nasal saline gel as needed for dry nostrils  Hives (urticaria) Use the least amount of medications while remaining hive free Cetirizine (Zyrtec) '10mg'$  twice a day and famotidine (Pepcid) 20 mg twice a day. If no symptoms for 7-14 days then decrease to. Cetirizine (Zyrtec) '10mg'$  twice a day and famotidine (Pepcid) 20 mg once a day.  If no symptoms for 7-14 days then decrease to. Cetirizine (Zyrtec) '10mg'$  twice a day.  If no symptoms for 7-14 days then decrease to. Cetirizine (Zyrtec)  '10mg'$  once a day. May use Benadryl (diphenhydramine) as needed for breakthrough hives       If symptoms return, then step up dosage Keep a detailed symptom journal including foods eaten, contact with allergens, medications taken, weather changes.   Atopic dermatitis Continue a twice a day moisturizing routine Try Eucrisa twice a day as needed to red and itchy areas Call the clinic if this treatment plan is not working well for you.  Follow up in 3 months or sooner if needed.  Return in about 3 months (around 08/18/2022), or if symptoms worsen or fail to improve.    Thank you for the opportunity to care for this patient.  Please do  not hesitate to contact me with questions.  Gareth Morgan, FNP [facility]

## 2022-05-18 ENCOUNTER — Ambulatory Visit (INDEPENDENT_AMBULATORY_CARE_PROVIDER_SITE_OTHER): Payer: 59 | Admitting: Family Medicine

## 2022-05-18 ENCOUNTER — Encounter: Payer: Self-pay | Admitting: Family Medicine

## 2022-05-18 ENCOUNTER — Other Ambulatory Visit: Payer: Self-pay

## 2022-05-18 VITALS — BP 110/76 | HR 72 | Temp 97.9°F | Resp 16 | Ht 63.0 in | Wt 152.8 lb

## 2022-05-18 DIAGNOSIS — L2084 Intrinsic (allergic) eczema: Secondary | ICD-10-CM | POA: Diagnosis not present

## 2022-05-18 DIAGNOSIS — L299 Pruritus, unspecified: Secondary | ICD-10-CM | POA: Insufficient documentation

## 2022-05-18 DIAGNOSIS — R062 Wheezing: Secondary | ICD-10-CM

## 2022-05-18 DIAGNOSIS — J310 Chronic rhinitis: Secondary | ICD-10-CM | POA: Insufficient documentation

## 2022-05-18 DIAGNOSIS — L509 Urticaria, unspecified: Secondary | ICD-10-CM

## 2022-05-18 DIAGNOSIS — J3089 Other allergic rhinitis: Secondary | ICD-10-CM

## 2022-05-18 MED ORDER — FAMOTIDINE 20 MG PO TABS: 20.0000 mg | ORAL_TABLET | Freq: Two times a day (BID) | ORAL | 2 refills | Status: DC

## 2022-05-18 MED ORDER — MONTELUKAST SODIUM 10 MG PO TABS: 10.0000 mg | ORAL_TABLET | Freq: Every day | ORAL | 5 refills | Status: DC

## 2022-05-18 MED ORDER — CETIRIZINE HCL 10 MG PO TABS: 10.0000 mg | ORAL_TABLET | Freq: Two times a day (BID) | ORAL | 2 refills | Status: DC | PRN

## 2022-05-19 ENCOUNTER — Encounter: Payer: Self-pay | Admitting: Family Medicine

## 2022-05-21 MED ORDER — LEVALBUTEROL TARTRATE 45 MCG/ACT IN AERO: 2.0000 | INHALATION_SPRAY | RESPIRATORY_TRACT | 1 refills | Status: DC | PRN

## 2022-05-22 LAB — ALLERGENS, ZONE 2
Alternaria Alternata IgE: <0.1 kU/L
Amer Sycamore IgE Qn: <0.1 kU/L
Aspergillus Fumigatus IgE: <0.1 kU/L
Bahia Grass IgE: <0.1 kU/L
Bermuda Grass IgE: <0.1 kU/L
Cat Dander IgE: <0.1 kU/L
Cedar, Mountain IgE: <0.1 kU/L
Cladosporium Herbarum IgE: <0.1 kU/L
Cockroach, American IgE: <0.1 kU/L
Common Silver Birch IgE: <0.1 kU/L
D Farinae IgE: <0.1 kU/L
D Pteronyssinus IgE: <0.1 kU/L
Dog Dander IgE: <0.1 kU/L
Elm, American IgE: <0.1 kU/L
Hickory, White IgE: <0.1 kU/L
Johnson Grass IgE: <0.1 kU/L
Maple/Box Elder IgE: <0.1 kU/L
Mucor Racemosus IgE: <0.1 kU/L
Mugwort IgE Qn: <0.1 kU/L
Nettle IgE: <0.1 kU/L
Oak, White IgE: <0.1 kU/L
Penicillium Chrysogen IgE: <0.1 kU/L
Pigweed, Rough IgE: <0.1 kU/L
Plantain, English IgE: <0.1 kU/L
Ragweed, Short IgE: 2.44 kU/L — AB
Sheep Sorrel IgE Qn: <0.1 kU/L
Stemphylium Herbarum IgE: <0.1 kU/L
Sweet gum IgE RAST Ql: <0.1 kU/L
Timothy Grass IgE: 0.16 kU/L — AB
White Mulberry IgE: <0.1 kU/L

## 2022-05-22 LAB — ALLERGEN, CHICKEN F83: Chicken IgE: <0.1 kU/L

## 2022-05-24 NOTE — Progress Notes (Signed)
Can you please let this patient know that her environmental allergy testing was positive to grass pollen and ragweed pollen. Please send out avoidance measures. Lab to chicken was negative. Thank you

## 2022-05-31 ENCOUNTER — Ambulatory Visit
Admission: RE | Admit: 2022-05-31 | Discharge: 2022-05-31 | Disposition: A | Discharge status: Home / Self Care | Payer: 59 | Source: Ambulatory Visit | Attending: Obstetrics and Gynecology | Admitting: Obstetrics and Gynecology

## 2022-05-31 DIAGNOSIS — N6320 Unspecified lump in the left breast, unspecified quadrant: Secondary | ICD-10-CM

## 2022-06-07 ENCOUNTER — Encounter: Payer: Self-pay | Admitting: Dermatology

## 2022-06-07 ENCOUNTER — Ambulatory Visit (INDEPENDENT_AMBULATORY_CARE_PROVIDER_SITE_OTHER): Payer: 59 | Admitting: Dermatology

## 2022-06-07 VITALS — BP 123/80 | HR 58

## 2022-06-07 DIAGNOSIS — L988 Other specified disorders of the skin and subcutaneous tissue: Secondary | ICD-10-CM

## 2022-06-07 DIAGNOSIS — A630 Anogenital (venereal) warts: Secondary | ICD-10-CM | POA: Diagnosis not present

## 2022-06-07 NOTE — Patient Instructions (Signed)
Cryotherapy Aftercare  Wash gently with soap and water everyday.   Apply Vaseline and Band-Aid daily until healed.     Due to recent changes in healthcare laws, you may see results of your pathology and/or laboratory studies on MyChart before the doctors have had a chance to review them. We understand that in some cases there may be results that are confusing or concerning to you. Please understand that not all results are received at the same time and often the doctors may need to interpret multiple results in order to provide you with the best plan of care or course of treatment. Therefore, we ask that you please give us 2 business days to thoroughly review all your results before contacting the office for clarification. Should we see a critical lab result, you will be contacted sooner.   If You Need Anything After Your Visit  If you have any questions or concerns for your doctor, please call our main line at [phone] and press option 4 to reach your doctor's medical assistant. If no one answers, please leave a voicemail as directed and we will return your call as soon as possible. Messages left after 4 pm will be answered the following business day.   You may also send us a message via MyChart. We typically respond to MyChart messages within 1-2 business days.  For prescription refills, please ask your pharmacy to contact our office. Our fax number is [phone].  If you have an urgent issue when the clinic is closed that cannot wait until the next business day, you can page your doctor at the number below.    Please note that while we do our best to be available for urgent issues outside of office hours, we are not available 24/7.   If you have an urgent issue and are unable to reach us, you may choose to seek medical care at your doctor's office, retail clinic, urgent care center, or emergency room.  If you have a medical emergency, please immediately call 911 or go to the  emergency department.  Pager Numbers  - Dr. Kowalski: [phone]  - Dr. Moye: [phone]  - Dr. Stewart: [phone]  In the event of inclement weather, please call our main line at [phone] for an update on the status of any delays or closures.  Dermatology Medication Tips: Please keep the boxes that topical medications come in in order to help keep track of the instructions about where and how to use these. Pharmacies typically print the medication instructions only on the boxes and not directly on the medication tubes.   If your medication is too expensive, please contact our office at [phone] option 4 or send us a message through MyChart.   We are unable to tell what your co-pay for medications will be in advance as this is different depending on your insurance coverage. However, we may be able to find a substitute medication at lower cost or fill out paperwork to get insurance to cover a needed medication.   If a prior authorization is required to get your medication covered by your insurance company, please allow us 1-2 business days to complete this process.  Drug prices often vary depending on where the prescription is filled and some pharmacies may offer cheaper prices.  The website www.goodrx.com contains coupons for medications through different pharmacies. The prices here do not account for what the cost may be with help from insurance (it may be cheaper with your insurance), but the website can   give you the price if you did not use any insurance.  - You can print the associated coupon and take it with your prescription to the pharmacy.  - You may also stop by our office during regular business hours and pick up a GoodRx coupon card.  - If you need your prescription sent electronically to a different pharmacy, notify our office through [facility] MyChart or by phone at [phone] option 4.     Si Usted Necesita Algo Despus de Su Visita  Tambin puede  enviarnos un mensaje a travs de MyChart. Por lo general respondemos a los mensajes de MyChart en el transcurso de 1 a 2 das hbiles.  Para renovar recetas, por favor pida a su farmacia que se ponga en contacto con nuestra oficina. Nuestro nmero de fax es el [phone].  Si tiene un asunto urgente cuando la clnica est cerrada y que no puede esperar hasta el siguiente da hbil, puede llamar/localizar a su doctor(a) al nmero que aparece a continuacin.   Por favor, tenga en cuenta que aunque hacemos todo lo posible para estar disponibles para asuntos urgentes fuera del horario de oficina, no estamos disponibles las 24 horas del da, los 7 das de la semana.   Si tiene un problema urgente y no puede comunicarse con nosotros, puede optar por buscar atencin mdica  en el consultorio de su doctor(a), en una clnica privada, en un centro de atencin urgente o en una sala de emergencias.  Si tiene una emergencia mdica, por favor llame inmediatamente al 911 o vaya a la sala de emergencias.  Nmeros de bper  - Dr. Kowalski: [phone]  - Dra. Moye: [phone]  - Dra. Stewart: [phone]  En caso de inclemencias del tiempo, por favor llame a nuestra lnea principal al [phone] para una actualizacin sobre el estado de cualquier retraso o cierre.  Consejos para la medicacin en dermatologa: Por favor, guarde las cajas en las que vienen los medicamentos de uso tpico para ayudarle a seguir las instrucciones sobre dnde y cmo usarlos. Las farmacias generalmente imprimen las instrucciones del medicamento slo en las cajas y no directamente en los tubos del medicamento.   Si su medicamento es muy caro, por favor, pngase en contacto con nuestra oficina llamando al [phone] y presione la opcin 4 o envenos un mensaje a travs de MyChart.   No podemos decirle cul ser su copago por los medicamentos por adelantado ya que esto es diferente dependiendo de la cobertura de su seguro.  Sin embargo, es posible que podamos encontrar un medicamento sustituto a menor costo o llenar un formulario para que el seguro cubra el medicamento que se considera necesario.   Si se requiere una autorizacin previa para que su compaa de seguros cubra su medicamento, por favor permtanos de 1 a 2 das hbiles para completar este proceso.  Los precios de los medicamentos varan con frecuencia dependiendo del lugar de dnde se surte la receta y alguna farmacias pueden ofrecer precios ms baratos.  El sitio web www.goodrx.com tiene cupones para medicamentos de diferentes farmacias. Los precios aqu no tienen en cuenta lo que podra costar con la ayuda del seguro (puede ser ms barato con su seguro), pero el sitio web puede darle el precio si no utiliz ningn seguro.  - Puede imprimir el cupn correspondiente y llevarlo con su receta a la farmacia.  - Tambin puede pasar por nuestra oficina durante el horario de atencin regular y recoger una tarjeta de cupones de GoodRx.  -   Si necesita que su receta se enve electrnicamente a una farmacia diferente, informe a nuestra oficina a travs de MyChart de [facility] o por telfono llamando al [phone] y presione la opcin 4.  

## 2022-06-07 NOTE — Progress Notes (Signed)
   Follow-Up Visit   Subjective  Kelly [name] is a 37 y.o. female who presents for the following: Botox for facial elastosis Recheck wart at left medial glute. Tx with LN2 at last visit  The following portions of the chart were reviewed this encounter and updated as appropriate: medications, allergies, medical history  Review of Systems:  No other skin or systemic complaints except as noted in HPI or Assessment and Plan.  Objective  Well appearing patient in no apparent distress; mood and affect are within normal limits.  A focused examination was performed of the face. Relevant physical exam findings are noted in the Assessment and Plan.   Assessment & Plan   WART Exam: small residual verrucous papules left medial glute x2    Discussed viral / HPV (Human Papilloma Virus) etiology and risk of spread /infectivity to other areas of body as well as to other people.  Multiple treatments and methods may be required to clear warts and it is possible treatment may not be successful.  Treatment risks include discoloration; scarring and there is still potential for wart recurrence.  Treatment Plan: Liquid nitrogen was applied for 10-12 seconds to the skin lesions and the expected blistering or scabbing reaction explained. Do not pick at the areas. Patient reminded to expect hypopigmented scars from the procedure. Return if lesions fail to fully resolve.     FACIAL ELASTOSIS  Location: See attached image   Informed consent: Discussed risks (infection, pain, bleeding, bruising, swelling, allergic reaction, paralysis of nearby muscles, eyelid droop, double vision, neck weakness, difficulty breathing, headache, undesirable cosmetic result, and need for additional treatment) and benefits of the procedure, as well as the alternatives.  Informed consent was obtained.  Preparation: The area was cleansed with alcohol.  Procedure Details:  Botox was injected into the dermis with a  30-gauge needle. Pressure applied to any bleeding. Ice packs offered for swelling.  Lot Number:  [id number] Expiration:  05/2024  Total Units Injected:  28  Plan: Tylenol may be used for headache.  Allow 2 weeks before returning to clinic for additional dosing as needed. Patient will call for any problems.  Return for Botox 3-4 months.  I, Emelia Salisbury, CMA, am acting as scribe for Forest Gleason, MD.   Documentation: I have reviewed the above documentation for accuracy and completeness, and I agree with the above.  Forest Gleason, MD

## 2022-06-11 ENCOUNTER — Encounter: Payer: Self-pay | Admitting: Dermatology

## 2022-08-13 ENCOUNTER — Other Ambulatory Visit: Payer: Self-pay | Admitting: Family Medicine

## 2022-08-24 ENCOUNTER — Ambulatory Visit: Payer: 59 | Admitting: Family Medicine

## 2022-08-30 ENCOUNTER — Ambulatory Visit: Payer: 59 | Admitting: Dermatology

## 2022-09-04 ENCOUNTER — Encounter: Payer: Self-pay | Admitting: Family Medicine

## 2022-09-24 ENCOUNTER — Ambulatory Visit (INDEPENDENT_AMBULATORY_CARE_PROVIDER_SITE_OTHER): Payer: Self-pay | Admitting: Dermatology

## 2022-09-24 ENCOUNTER — Ambulatory Visit: Payer: 59 | Admitting: Dermatology

## 2022-09-24 DIAGNOSIS — L988 Other specified disorders of the skin and subcutaneous tissue: Secondary | ICD-10-CM

## 2022-09-24 NOTE — Progress Notes (Signed)
   Follow-Up Visit   Subjective  Kelly [name] is a 37 y.o. female who presents for the following: Botox   The following portions of the chart were reviewed this encounter and updated as appropriate: medications, allergies, medical history  Review of Systems:  No other skin or systemic complaints except as noted in HPI or Assessment and Plan.  Objective  Well appearing patient in no apparent distress; mood and affect are within normal limits.   A focused examination was performed of the following areas: face  Relevant exam findings are noted in the Assessment and Plan.  face Rhytides and volume loss.        Assessment & Plan     Elastosis of skin face  Botox 28 units Frown Complex - 20 units Forehead - 8 units  Consider decreasing to 1.25 units x 6 across forehead  Filling material injection - face Location: See attached image  Informed consent: Discussed risks (infection, pain, bleeding, bruising, swelling, allergic reaction, paralysis of nearby muscles, eyelid droop, double vision, neck weakness, difficulty breathing, headache, undesirable cosmetic result, and need for additional treatment) and benefits of the procedure, as well as the alternatives.  Informed consent was obtained.  Preparation: The area was cleansed with alcohol.  Procedure Details:  Botox was injected into the dermis with a 30-gauge needle. Pressure applied to any bleeding. Ice packs offered for swelling.  Lot Number:  [id number] Expiration:  08/2024  Total Units Injected:  28  Plan: Patient was instructed to remain upright for 4 hours. Patient was instructed to avoid massaging the face and avoid vigorous exercise for the rest of the day. Tylenol may be used for headache.  Allow 2 weeks before returning to clinic for additional dosing as needed. Patient will call for any problems.     Return in about 3 months (around 12/25/2022) for Botox.  Anise Salvo, RMA, am acting as scribe  for Willeen Niece, MD .   Documentation: I have reviewed the above documentation for accuracy and completeness, and I agree with the above.  Willeen Niece, MD

## 2022-09-24 NOTE — Patient Instructions (Signed)
Due to recent changes in healthcare laws, you may see results of your pathology and/or laboratory studies on MyChart before the doctors have had a chance to review them. We understand that in some cases there may be results that are confusing or concerning to you. Please understand that not all results are received at the same time and often the doctors may need to interpret multiple results in order to provide you with the best plan of care or course of treatment. Therefore, we ask that you please give us 2 business days to thoroughly review all your results before contacting the office for clarification. Should we see a critical lab result, you will be contacted sooner.   If You Need Anything After Your Visit  If you have any questions or concerns for your doctor, please call our main line at [phone] and press option 4 to reach your doctor's medical assistant. If no one answers, please leave a voicemail as directed and we will return your call as soon as possible. Messages left after 4 pm will be answered the following business day.   You may also send us a message via MyChart. We typically respond to MyChart messages within 1-2 business days.  For prescription refills, please ask your pharmacy to contact our office. Our fax number is [phone].  If you have an urgent issue when the clinic is closed that cannot wait until the next business day, you can page your doctor at the number below.    Please note that while we do our best to be available for urgent issues outside of office hours, we are not available 24/7.   If you have an urgent issue and are unable to reach us, you may choose to seek medical care at your doctor's office, retail clinic, urgent care center, or emergency room.  If you have a medical emergency, please immediately call 911 or go to the emergency department.  Pager Numbers  - Dr. Kowalski: [phone]  - Dr. Moye: [phone]  - Dr. Stewart:  [phone]  In the event of inclement weather, please call our main line at [phone] for an update on the status of any delays or closures.  Dermatology Medication Tips: Please keep the boxes that topical medications come in in order to help keep track of the instructions about where and how to use these. Pharmacies typically print the medication instructions only on the boxes and not directly on the medication tubes.   If your medication is too expensive, please contact our office at [phone] option 4 or send us a message through MyChart.   We are unable to tell what your co-pay for medications will be in advance as this is different depending on your insurance coverage. However, we may be able to find a substitute medication at lower cost or fill out paperwork to get insurance to cover a needed medication.   If a prior authorization is required to get your medication covered by your insurance company, please allow us 1-2 business days to complete this process.  Drug prices often vary depending on where the prescription is filled and some pharmacies may offer cheaper prices.  The website www.goodrx.com contains coupons for medications through different pharmacies. The prices here do not account for what the cost may be with help from insurance (it may be cheaper with your insurance), but the website can give you the price if you did not use any insurance.  - You can print the associated coupon and take it with   your prescription to the pharmacy.  - You may also stop by our office during regular business hours and pick up a GoodRx coupon card.  - If you need your prescription sent electronically to a different pharmacy, notify our office through [facility] MyChart or by phone at [phone] option 4.     

## 2022-10-26 ENCOUNTER — Ambulatory Visit: Payer: 59 | Admitting: Family Medicine

## 2022-11-01 ENCOUNTER — Ambulatory Visit (INDEPENDENT_AMBULATORY_CARE_PROVIDER_SITE_OTHER): Payer: 59 | Admitting: Family Medicine

## 2022-11-01 ENCOUNTER — Other Ambulatory Visit: Payer: Self-pay

## 2022-11-01 ENCOUNTER — Encounter: Payer: Self-pay | Admitting: Family Medicine

## 2022-11-01 ENCOUNTER — Other Ambulatory Visit: Payer: Self-pay | Admitting: Family Medicine

## 2022-11-01 VITALS — BP 118/60 | HR 65 | Temp 97.8°F | Resp 16 | Wt 151.3 lb

## 2022-11-01 DIAGNOSIS — J4540 Moderate persistent asthma, uncomplicated: Secondary | ICD-10-CM

## 2022-11-01 DIAGNOSIS — L509 Urticaria, unspecified: Secondary | ICD-10-CM | POA: Diagnosis not present

## 2022-11-01 DIAGNOSIS — L2084 Intrinsic (allergic) eczema: Secondary | ICD-10-CM

## 2022-11-01 DIAGNOSIS — K219 Gastro-esophageal reflux disease without esophagitis: Secondary | ICD-10-CM | POA: Insufficient documentation

## 2022-11-01 DIAGNOSIS — J302 Other seasonal allergic rhinitis: Secondary | ICD-10-CM

## 2022-11-01 MED ORDER — ARNUITY ELLIPTA 100 MCG/ACT IN AEPB: 1.0000 | INHALATION_SPRAY | Freq: Every day | RESPIRATORY_TRACT | 5 refills | Status: DC

## 2022-11-01 MED ORDER — FLUTICASONE PROPIONATE 50 MCG/ACT NA SUSP: 2.0000 | Freq: Every day | NASAL | 5 refills | Status: DC

## 2022-11-01 MED ORDER — FAMOTIDINE 20 MG PO TABS: 20.0000 mg | ORAL_TABLET | Freq: Two times a day (BID) | ORAL | 5 refills | Status: AC

## 2022-11-01 MED ORDER — LEVALBUTEROL TARTRATE 45 MCG/ACT IN AERO: 2.0000 | INHALATION_SPRAY | RESPIRATORY_TRACT | 1 refills | Status: DC | PRN

## 2022-11-01 NOTE — Patient Instructions (Addendum)
Asthma Not well-controlled Begin Arnuity 100-1 puff once a day to prevent cough or wheeze Continue Xopenex 2 puffs once every 4-6 hours as needed for cough or wheeze  Reflux Moderately well-controlled Continue dietary and lifestyle modifications as listed below Begin famotidine 20 mg twice a day to control reflux. We will evaluate the effect at your follow up visit.   Allergic rhinitis Stable Continue allergen avoidance measures directed toward grass pollen and ragweed pollen as listed below Continue cetirizine 10 mg once a day as needed for a runny nose Continue Flonase 2 sprays in each nostril once a day as needed for a stuffy nose.  In the right nostril, point the applicator out toward the right ear. In the left nostril, point the applicator out toward the left ear Consider saline nasal rinses as needed for nasal symptoms. Use this before any medicated nasal sprays for best result Consider nasal saline gel as needed for dry nostrils  Hives (urticaria) Stable Use the least amount of medications while remaining hive free Cetirizine (Zyrtec) 10mg  twice a day and famotidine (Pepcid) 20 mg twice a day. If no symptoms for 7-14 days then decrease to. Cetirizine (Zyrtec) 10mg  twice a day and famotidine (Pepcid) 20 mg once a day.  If no symptoms for 7-14 days then decrease to. Cetirizine (Zyrtec) 10mg  twice a day.  If no symptoms for 7-14 days then decrease to. Cetirizine (Zyrtec) 10mg  once a day. May use Benadryl (diphenhydramine) as needed for breakthrough hives       If symptoms return, then step up dosage Keep a detailed symptom journal including foods eaten, contact with allergens, medications taken, weather changes.   Atopic dermatitis Stable Continue a twice a day moisturizing routine Continue Eucrisa twice a day as needed to red and itchy areas  Call the clinic if this treatment plan is not working well for you.  Follow up in 3 months or sooner if needed.  Reducing Pollen  Exposure The American Academy of All[employer] suggests the following steps to reduce your exposure to pollen during allergy seasons. Do not hang sheets or clothing out to dry; pollen may collect on these items. Do not mow lawns or spend time around freshly cut grass; mowing stirs up pollen. Keep windows closed at night.  Keep car windows closed while driving. Minimize morning activities outdoors, a time when pollen counts are usually at their highest. Stay indoors as much as possible when pollen counts or humidity is high and on windy days when pollen tends to remain in the air longer. Use air conditioning when possible.  Many air conditioners have filters that trap the pollen spores. Use a HEPA room air filter to remove pollen form the indoor air you breathe.  Lifestyle Changes for Controlling GERD When you have GERD, stomach acid feels as if it's backing up toward your mouth. Whether or not you take medication to control your GERD, your symptoms can often be improved with lifestyle changes.   Raise Your Head Reflux is more likely to strike when you're lying down flat, because stomach fluid can flow backward more easily. Raising the head of your bed 4-6 inches can help. To do this: Slide blocks or books under the legs at the head of your bed. Or, place a wedge under the mattress. Many foam stores can make a suitable wedge for you. The wedge should run from your waist to the top of your head. Don't just prop your head on several pillows. This increases pressure  on your stomach. It can make GERD worse.  Watch Your Eating Habits Certain foods may increase the acid in your stomach or relax the lower esophageal sphincter, making GERD more likely. It's best to avoid the following: Coffee, tea, and carbonated drinks (with and without caffeine) Fatty, fried, or spicy food Mint, chocolate, onions, and tomatoes Any other foods that seem to irritate your stomach or cause you  pain  Relieve the Pressure Eat smaller meals, even if you have to eat more often. Don't lie down right after you eat. Wait a few hours for your stomach to empty. Avoid tight belts and tight-fitting clothes. Lose excess weight.  Tobacco and Alcohol Avoid smoking tobacco and drinking alcohol. They can make GERD symptoms worse.

## 2022-11-01 NOTE — Progress Notes (Signed)
[address] [place] [address] Dept: [phone]  FOLLOW UP NOTE  Patient ID: Kelly [name], female    DOB: Jun 24, 1985  Age: 37 y.o. MRN: [id number] Date of Office Visit: 11/01/2022  Assessment  Chief Complaint: Follow-up (Stopped montelukast due to no change per patient. )  HPI Kelly [name] is a 37 year old female who presents to the clinic for follow-up visit.  She was last seen in this clinic on 05/18/2022 by Thermon Leyland, FNP, for evaluation of wheeze, allergic rhinitis, atopic dermatitis, chronic urticaria.    At today's visit, she reports her asthma has not been well-controlled over the last several weeks with an increase in symptoms over the last 4 days.  She reports symptoms including wheezing in the morning and with activity and cough producing clear mucus.  She denies shortness of breath with activity or rest.  She denies nighttime symptoms.  She reports that she stopped taking montelukast in June as she did not feel this was of any benefit.  She has used Xopenex over the last 4 days with some improvement in symptoms.    Allergic rhinitis is reported as well-controlled at this time with no rhinorrhea, nasal congestion, sneezing, or postnasal drainage.  She continues cetirizine daily and uses Flonase as needed.  She is not currently using a nasal saline rinse.  Her last environmental allergy testing via lab was on 05/18/2022 and was positive to grass pollen and ragweed.  Her last food allergy testing to chicken was negative via lab on 05/18/2022.  Reflux is reported as well-controlled with no symptoms including heartburn or vomiting.  She is not currently taking a medication to control reflux.  She denies any episodes of hives since her last visit to this clinic.  She continues cetirizine 10 mg once a day.  Atopic dermatitis is reported as well-controlled with a daily moisturizer.  She continues occasional Eucrisa and follows up with her dermatologist as recommended.  Her  current medications are listed in the chart.  Drug Allergies:  No Known Allergies  Physical Exam: BP 118/60   Pulse 65   Temp 97.8 F (36.6 C) (Temporal)   Resp 16   Wt 151 lb 4.8 oz (68.6 kg)   SpO2 98%   BMI 26.80 kg/m    Physical Exam Vitals reviewed.  Constitutional:      Appearance: Normal appearance.  HENT:     Head: Normocephalic and atraumatic.     Right Ear: Tympanic membrane normal.     Left Ear: Tympanic membrane normal.     Nose:     Comments: Bilateral naris slightly erythematous with thin clear nasal drainage noted.  Pharynx normal.  Ears normal.  Eyes normal.    Mouth/Throat:     Pharynx: Oropharynx is clear.  Eyes:     Conjunctiva/sclera: Conjunctivae normal.  Cardiovascular:     Rate and Rhythm: Normal rate and regular rhythm.     Heart sounds: Normal heart sounds. No murmur heard. Pulmonary:     Effort: Pulmonary effort is normal.     Breath sounds: Normal breath sounds.     Comments: Lungs clear to auscultation Musculoskeletal:        General: Normal range of motion.     Cervical back: Normal range of motion and neck supple.  Skin:    General: Skin is warm and dry.  Neurological:     Mental Status: She is alert and oriented to person, place, and time.  Psychiatric:  Mood and Affect: Mood normal.        Behavior: Behavior normal.        Thought Content: Thought content normal.        Judgment: Judgment normal.     Diagnostics: FVC 2.75 which is 77% of predicted value, FEV1 1.87 which is 63% of predicted value.  Postbronchodilator FVC 2.79, FEV1 1.99.  Postbronchodilator spirometry indicates possible airway obstruction with 6% improvement in FEV1.  Assessment and Plan: 1. Not well controlled moderate persistent asthma   2. Gastroesophageal reflux disease, unspecified whether esophagitis present     Meds ordered this encounter  Medications   Fluticasone Furoate (ARNUITY ELLIPTA) 100 MCG/ACT AEPB    Sig: Inhale 1 puff into the lungs  daily.    Dispense:  1 each    Refill:  5   famotidine (PEPCID) 20 MG tablet    Sig: Take 1 tablet (20 mg total) by mouth 2 (two) times daily.    Dispense:  60 tablet    Refill:  5   levalbuterol (XOPENEX HFA) 45 MCG/ACT inhaler    Sig: Inhale 2 puffs into the lungs every 4 (four) hours as needed for wheezing.    Dispense:  15 g    Refill:  1   fluticasone (FLONASE) 50 MCG/ACT nasal spray    Sig: Place 2 sprays into both nostrils daily.    Dispense:  16 g    Refill:  5    Patient Instructions  Asthma Begin Arnuity 100-1 puff once a day to prevent cough or wheeze Continue Xopenex 2 puffs once every 4-6 hours as needed for cough or wheeze  Reflux Continue dietary and lifestyle modifications as listed below Begin famotidine 20 mg twice a day to control reflux. We will evaluate the effect at your follow up visit.   Allergic rhinitis Continue cetirizine 10 mg once a day as needed for a runny nose Continue Flonase or Nasacort 2 sprays in each nostril once a day as needed for a stuffy nose.  In the right nostril, point the applicator out toward the right ear. In the left nostril, point the applicator out toward the left ear Consider saline nasal rinses as needed for nasal symptoms. Use this before any medicated nasal sprays for best result Consider nasal saline gel as needed for dry nostrils  Hives (urticaria) Use the least amount of medications while remaining hive free Cetirizine (Zyrtec) 10mg  twice a day and famotidine (Pepcid) 20 mg twice a day. If no symptoms for 7-14 days then decrease to. Cetirizine (Zyrtec) 10mg  twice a day and famotidine (Pepcid) 20 mg once a day.  If no symptoms for 7-14 days then decrease to. Cetirizine (Zyrtec) 10mg  twice a day.  If no symptoms for 7-14 days then decrease to. Cetirizine (Zyrtec) 10mg  once a day. May use Benadryl (diphenhydramine) as needed for breakthrough hives       If symptoms return, then step up dosage Keep a detailed symptom journal  including foods eaten, contact with allergens, medications taken, weather changes.   Atopic dermatitis Continue a twice a day moisturizing routine Try Eucrisa twice a day as needed to red and itchy areas Call the clinic if this treatment plan is not working well for you.  Follow up in 3 months or sooner if needed.   Return in about 3 months (around 02/01/2023), or if symptoms worsen or fail to improve.    Thank you for the opportunity to care for this patient.  Please do not hesitate  to contact me with questions.  Thermon Leyland, FNP [facility]

## 2022-11-02 ENCOUNTER — Other Ambulatory Visit: Payer: Self-pay | Admitting: Family Medicine

## 2022-11-10 ENCOUNTER — Other Ambulatory Visit: Payer: Self-pay | Admitting: Family Medicine

## 2023-01-07 ENCOUNTER — Ambulatory Visit (INDEPENDENT_AMBULATORY_CARE_PROVIDER_SITE_OTHER): Payer: Self-pay | Admitting: Dermatology

## 2023-01-07 VITALS — BP 120/73

## 2023-01-07 DIAGNOSIS — L988 Other specified disorders of the skin and subcutaneous tissue: Secondary | ICD-10-CM

## 2023-01-07 NOTE — Patient Instructions (Signed)

## 2023-01-07 NOTE — Progress Notes (Signed)
   Follow-Up Visit   Subjective  Taianna Stadtler is a 37 y.o. female who presents for the following: Botox for facial elastosis  The following portions of the chart were reviewed this encounter and updated as appropriate: medications, allergies, medical history  Review of Systems:  No other skin or systemic complaints except as noted in HPI or Assessment and Plan.  Objective  Well appearing patient in no apparent distress; mood and affect are within normal limits.  A focused examination was performed of the face.  Relevant physical exam findings are noted in the Assessment and Plan.  Injection map photo     Assessment & Plan    Facial Elastosis Botox 28 units injected today to: - Frown complex 20 units - Forehead 8 units Location: See attached image  Informed consent: Discussed risks (infection, pain, bleeding, bruising, swelling, allergic reaction, paralysis of nearby muscles, eyelid droop, double vision, neck weakness, difficulty breathing, headache, undesirable cosmetic result, and need for additional treatment) and benefits of the procedure, as well as the alternatives.  Informed consent was obtained.  Preparation: The area was cleansed with alcohol.  Procedure Details:  Botox was injected into the dermis with a 30-gauge needle. Pressure applied to any bleeding. Ice packs offered for swelling.  Lot Number:  [id number] Expiration:  06/26  Total Units Injected:  28  Plan: Tylenol may be used for headache.  Allow 2 weeks before returning to clinic for additional dosing as needed. Patient will call for any problems.  Return for 4-17m Botox.  I, Ardis Rowan, RMA, am acting as scribe for Willeen Niece, MD .   Documentation: I have reviewed the above documentation for accuracy and completeness, and I agree with the above.  Willeen Niece, MD

## 2023-02-03 NOTE — Progress Notes (Unsigned)
Follow Up Note  RE: Kelly [name] MRN: [id number] DOB: 06/12/1985 Date of Office Visit: 02/04/2023  Referring provider: Ollen Bowl, MD Primary care provider: Ollen Bowl, MD  Chief Complaint: No chief complaint on file.  History of Present Illness: I had the pleasure of seeing Kelly [name] for a follow up visit at the [facility] on 02/03/2023. She is a 37 y.o. female, who is being followed for asthma, reflux, allergic rhinitis, hives, atopic dermatitis. Her previous allergy office visit was on 11/01/2022 with Thermon Leyland, FNP. Today is a regular follow up visit.  Discussed the use of AI scribe software for clinical note transcription with the patient, who gave verbal consent to proceed.  History of Present Illness            Asthma Begin Arnuity 100-1 puff once a day to prevent cough or wheeze Continue Xopenex 2 puffs once every 4-6 hours as needed for cough or wheeze   Reflux Continue dietary and lifestyle modifications as listed below Begin famotidine 20 mg twice a day to control reflux. We will evaluate the effect at your follow up visit.    Allergic rhinitis Continue cetirizine 10 mg once a day as needed for a runny nose Continue Flonase or Nasacort 2 sprays in each nostril once a day as needed for a stuffy nose.  In the right nostril, point the applicator out toward the right ear. In the left nostril, point the applicator out toward the left ear Consider saline nasal rinses as needed for nasal symptoms. Use this before any medicated nasal sprays for best result Consider nasal saline gel as needed for dry nostrils   Hives (urticaria) Use the least amount of medications while remaining hive free Cetirizine (Zyrtec) 10mg  twice a day and famotidine (Pepcid) 20 mg twice a day. If no symptoms for 7-14 days then decrease to. Cetirizine (Zyrtec) 10mg  twice a day and famotidine (Pepcid) 20 mg once a day.  If no symptoms for 7-14 days then  decrease to. Cetirizine (Zyrtec) 10mg  twice a day.  If no symptoms for 7-14 days then decrease to. Cetirizine (Zyrtec) 10mg  once a day. May use Benadryl (diphenhydramine) as needed for breakthrough hives       If symptoms return, then step up dosage Keep a detailed symptom journal including foods eaten, contact with allergens, medications taken, weather changes.    Atopic dermatitis Continue a twice a day moisturizing routine Try Eucrisa twice a day as needed to red and itchy areas Call the clinic if this treatment plan is not working well for you.   Urticaria Started to break out 1+ year ago every few days with no known triggers. Takes zyrtec prn which helps and hives resolve within 30 min. Concerned about chicken/poultry but sometimes tolerates with no issues. 2024 bloodwork CMP and TSH normal. Based on clinical history, she likely has chronic idiopathic urticaria. Discussed with patient, that urticaria is usually caused by release of histamine by cutaneous mast cells but sometimes it is non-histamine mediated. Explained that urticaria is not always associated with allergies. In most cases, the exact etiology for urticaria can not be established and it is considered idiopathic. Start zyrtec (cetirizine) 10mg  or allegra 180mg  once a day and may take twice a day if needed.  If symptoms are not controlled or causes drowsiness let us know. Avoid the following potential triggers: alcohol, tight clothing, NSAIDs, hot showers and getting overheated. Get bloodwork to rule out other etiologies. Skin testing  to poultry and chicken can be done at next visit just to rule it out.    Other allergic rhinitis Perennial PND symptoms for many years. Using Flonase prn with unknown benefit. No prior allergy/ENT evaluation. 1 dog at home. Unable to skin test today as [facility] won't allow new patient visits and procedures on the same day.  Return for skin testing (1-59) Take antihistamine daily as above. If no  improvement will do a trial of either azelastine or Atrovent nasal spray next.    Wheezing Wheezing at times. Used to take albuterol during bronchitis prn but it caused palpitations in the past. Some reflux symptoms.  Get spirometry at next visit. Monitor symptoms. Declined rescue inhaler as symptoms not bothersome enough today.  Recommend xopenex rescue inhaler due to palpitations with albuterol.   Other atopic dermatitis See below for proper skin care.  I reviewed the bloodwork. Blood count, autoimmune screener, inflammation markers, tryptase (checks for mast cell issues) and alpha gal (checks for red meat allergy) were all normal which is great.   Your chronic urticaria index was borderline elevated meaning your body is producing slightly more histamine than normal. Usually there is no allergic trigger for this and treatment includes antihistamines.   Continue meds as per last visit and keep follow up in march. Start zyrtec (cetirizine) 10mg  or allegra 180mg  once a day and may take twice a day if needed.  Assessment and Plan: Kelly [name] is a 37 y.o. female with: *** Assessment and Plan              No follow-ups on file.  No orders of the defined types were placed in this encounter.  Lab Orders  No laboratory test(s) ordered today    Diagnostics: Spirometry:  Tracings reviewed. Her effort: {Blank single:19197::"Good reproducible efforts.","It was hard to get consistent efforts and there is a question as to whether this reflects a maximal maneuver.","Poor effort, data can not be interpreted."} FVC: ***L FEV1: ***L, ***% predicted FEV1/FVC ratio: ***% Interpretation: {Blank single:19197::"Spirometry consistent with mild obstructive disease","Spirometry consistent with moderate obstructive disease","Spirometry consistent with severe obstructive disease","Spirometry consistent with possible restrictive disease","Spirometry consistent with mixed obstructive and restrictive  disease","Spirometry uninterpretable due to technique","Spirometry consistent with normal pattern","No overt abnormalities noted given today's efforts"}.  Please see scanned spirometry results for details.  Skin Testing: {Blank single:19197::"Select foods","Environmental allergy panel","Environmental allergy panel and select foods","Food allergy panel","None","Deferred due to recent antihistamines use"}. *** Results discussed with patient/family.   Medication List:  Current Outpatient Medications  Medication Sig Dispense Refill   cetirizine (ZYRTEC) 10 MG tablet TAKE 1 TABLET (10 MG TOTAL) BY MOUTH 2 (TWO) TIMES DAILY AS NEEDED FOR ALLERGIES. 180 tablet 1   clobetasol ointment (TEMOVATE) 0.05 % Apply to hands BID up to two weeks. Avoid applying to face, groin, and axilla. Use as directed. Long-term use can cause thinning of the skin. 45 g 0   famotidine (PEPCID) 20 MG tablet Take 1 tablet (20 mg total) by mouth 2 (two) times daily. 60 tablet 5   fluticasone (FLONASE) 50 MCG/ACT nasal spray Place 2 sprays into both nostrils daily. 16 g 5   Fluticasone Furoate (ARNUITY ELLIPTA) 100 MCG/ACT AEPB Inhale 1 puff into the lungs daily. 1 each 5   levalbuterol (XOPENEX HFA) 45 MCG/ACT inhaler INHALE 2 PUFFS INTO THE LUNGS EVERY 4 HOURS AS NEEDED FOR WHEEZE 15 each 1   levonorgestrel (MIRENA, 52 MG,) 20 MCG/DAY IUD 1 each by Intrauterine route once.     Magnesium 250  MG TABS 1 tablet with a meal Orally Once a day     metoprolol tartrate (LOPRESSOR) 25 MG tablet Take by mouth.     montelukast (SINGULAIR) 10 MG tablet TAKE 1 TABLET BY MOUTH EVERYDAY AT BEDTIME 90 tablet 1   No current facility-administered medications for this visit.   Allergies: No Known Allergies I reviewed her past medical history, social history, family history, and environmental history and no significant changes have been reported from her previous visit.  Review of Systems  Constitutional:  Negative for appetite change,  chills, fever and unexpected weight change.  HENT:  Positive for postnasal drip. Negative for congestion and rhinorrhea.   Eyes:  Negative for itching.  Respiratory:  Positive for cough and wheezing. Negative for chest tightness and shortness of breath.   Cardiovascular:  Negative for chest pain.  Gastrointestinal:  Negative for abdominal pain.  Genitourinary:  Negative for difficulty urinating.  Skin:  Positive for rash.  Neurological:  Negative for headaches.    Objective: There were no vitals taken for this visit. There is no height or weight on file to calculate BMI. Physical Exam Vitals and nursing note reviewed.  Constitutional:      Appearance: Normal appearance. She is well-developed.  HENT:     Head: Normocephalic and atraumatic.     Right Ear: Tympanic membrane and external ear normal.     Left Ear: Tympanic membrane and external ear normal.     Nose: Nose normal.     Mouth/Throat:     Mouth: Mucous membranes are moist.     Pharynx: Oropharynx is clear.  Eyes:     Conjunctiva/sclera: Conjunctivae normal.  Cardiovascular:     Rate and Rhythm: Normal rate and regular rhythm.     Heart sounds: Normal heart sounds. No murmur heard.    No friction rub. No gallop.  Pulmonary:     Effort: Pulmonary effort is normal.     Breath sounds: Normal breath sounds. No wheezing, rhonchi or rales.  Musculoskeletal:     Cervical back: Neck supple.  Skin:    General: Skin is warm.     Findings: No rash.  Neurological:     Mental Status: She is alert and oriented to person, place, and time.  Psychiatric:        Behavior: Behavior normal.    Previous notes and tests were reviewed. The plan was reviewed with the patient/family, and all questions/concerned were addressed.  It was my pleasure to see Hanah today and participate in her care. Please feel free to contact me with any questions or concerns.  Sincerely,  Wyline Mood, DO Allergy & Immunology  [facility] office: [phone] [facility] office: [phone]

## 2023-02-04 ENCOUNTER — Encounter: Payer: Self-pay | Admitting: Allergy

## 2023-02-04 ENCOUNTER — Ambulatory Visit (INDEPENDENT_AMBULATORY_CARE_PROVIDER_SITE_OTHER): Payer: 59 | Admitting: Allergy

## 2023-02-04 ENCOUNTER — Other Ambulatory Visit: Payer: Self-pay

## 2023-02-04 VITALS — BP 106/60 | HR 63 | Temp 97.7°F | Resp 14 | Ht 63.75 in | Wt 151.3 lb

## 2023-02-04 DIAGNOSIS — L501 Idiopathic urticaria: Secondary | ICD-10-CM

## 2023-02-04 DIAGNOSIS — J4540 Moderate persistent asthma, uncomplicated: Secondary | ICD-10-CM | POA: Diagnosis not present

## 2023-02-04 DIAGNOSIS — R12 Heartburn: Secondary | ICD-10-CM

## 2023-02-04 DIAGNOSIS — J301 Allergic rhinitis due to pollen: Secondary | ICD-10-CM

## 2023-02-04 DIAGNOSIS — L2089 Other atopic dermatitis: Secondary | ICD-10-CM

## 2023-02-04 MED ORDER — ARNUITY ELLIPTA 200 MCG/ACT IN AEPB: 1.0000 | INHALATION_SPRAY | Freq: Every day | RESPIRATORY_TRACT | 3 refills | Status: DC

## 2023-02-04 NOTE — Patient Instructions (Addendum)
Breathing  Daily controller medication(s): Increase Arnuity 1 puff once a day and rinse mouth after each use.  May use levoalbuterol rescue inhaler 2 puffs every 4 to 6 hours as needed for shortness of breath, chest tightness, coughing, and wheezing. May use levoalbuterol rescue inhaler 2 puffs 5 to 15 minutes prior to strenuous physical activities. Monitor frequency of use - if you need to use it more than twice per week on a consistent basis let us know.  Breathing control goals:  Full participation in all desired activities (may need albuterol before activity) Albuterol use two times or less a week on average (not counting use with activity) Cough interfering with sleep two times or less a month Oral steroids no more than once a year No hospitalizations   Reflux See handout for lifestyle and dietary modifications. Only use famotidine 20mg  twice a day if needed. If you notice increased itching/rash/hives then okay to restart.    Allergic rhinitis 2024 bloodwork positive to ragweed and borderline to grass. Continue Zyrtec (cetirizine) daily. May take twice a day during allergy flares. Use Flonase (fluticasone) nasal spray 1-2 sprays per nostril once a day as needed for nasal congestion.  Nasal saline spray (i.e., Simply Saline) or nasal saline lavage (i.e., NeilMed) is recommended as needed and prior to medicated nasal sprays.  Urticaria Continue zyrtec (cetirizine) 10mg  daily as above.  Avoid the following potential triggers: alcohol, tight clothing, NSAIDs, hot showers and getting overheated. Continue proper skin care.   Follow up in 2 months or sooner if needed.   Skin care recommendations  Bath time: Always use lukewarm water. AVOID very hot or cold water. Keep bathing time to 5-10 minutes. Do NOT use bubble bath. Use a mild soap and use just enough to wash the dirty areas. Do NOT scrub skin vigorously.  After bathing, pat dry your skin with a towel. Do NOT rub or  scrub the skin.  Moisturizers and prescriptions:  ALWAYS apply moisturizers immediately after bathing (within 3 minutes). This helps to lock-in moisture. Use the moisturizer several times a day over the whole body. Good summer moisturizers include: Aveeno, CeraVe, Cetaphil. Good winter moisturizers include: Aquaphor, Vaseline, Cerave, Cetaphil, Eucerin, Vanicream. When using moisturizers along with medications, the moisturizer should be applied about one hour after applying the medication to prevent diluting effect of the medication or moisturize around where you applied the medications. When not using medications, the moisturizer can be continued twice daily as maintenance.  Laundry and clothing: Avoid laundry products with added color or perfumes. Use unscented hypo-allergenic laundry products such as Tide free, Cheer free & gentle, and All free and clear.  If the skin still seems dry or sensitive, you can try double-rinsing the clothes. Avoid tight or scratchy clothing such as wool. Do not use fabric softeners or dyer sheets.  Reducing Pollen Exposure Pollen seasons: trees (spring), grass (summer) and ragweed/weeds (fall). Keep windows closed in your home and car to lower pollen exposure.  Install air conditioning in the bedroom and throughout the house if possible.  Avoid going out in dry windy days - especially early morning. Pollen counts are highest between 5 - 10 AM and on dry, hot and windy days.  Save outside activities for late afternoon or after a heavy rain, when pollen levels are lower.  Avoid mowing of grass if you have grass pollen allergy. Be aware that pollen can also be transported indoors on people and pets.  Dry your clothes in an automatic dryer  rather than hanging them outside where they might collect pollen.  Rinse hair and eyes before bedtime.

## 2023-03-19 ENCOUNTER — Encounter: Payer: Self-pay | Admitting: Allergy

## 2023-05-01 ENCOUNTER — Ambulatory Visit: Payer: 59 | Admitting: Allergy

## 2023-05-02 ENCOUNTER — Encounter: Payer: 59 | Admitting: Dermatology

## 2023-05-03 ENCOUNTER — Other Ambulatory Visit: Payer: Self-pay | Admitting: Family Medicine

## 2023-05-15 NOTE — Telephone Encounter (Signed)
 Azelastine is not covered please advise on an alternative nasal spray.

## 2023-05-16 NOTE — Telephone Encounter (Signed)
 She can use Astepro (OTC) if needed for a runny nose. Thank you

## 2023-05-17 ENCOUNTER — Encounter: Payer: Self-pay | Admitting: Family Medicine

## 2023-05-21 MED ORDER — ARNUITY ELLIPTA 200 MCG/ACT IN AEPB: 1.0000 | INHALATION_SPRAY | Freq: Every day | RESPIRATORY_TRACT | 1 refills | Status: DC

## 2023-05-24 NOTE — Telephone Encounter (Signed)
 Patient has been notified. She is currently doing Flonase with no issues, but will buy the Astepro in the future if that changes.

## 2023-06-03 ENCOUNTER — Encounter: Payer: Self-pay | Admitting: Dermatology

## 2023-06-03 ENCOUNTER — Ambulatory Visit (INDEPENDENT_AMBULATORY_CARE_PROVIDER_SITE_OTHER): Payer: 59 | Admitting: Dermatology

## 2023-06-03 DIAGNOSIS — L988 Other specified disorders of the skin and subcutaneous tissue: Secondary | ICD-10-CM

## 2023-06-03 NOTE — Patient Instructions (Signed)

## 2023-06-03 NOTE — Progress Notes (Signed)
   Follow-Up Visit   Subjective  Kelly [name] is a 38 y.o. female who presents for the following: Botox for facial elastosis, pt would like to discuss adding more to forehead.  Central forehead fold bothers her the most  The following portions of the chart were reviewed this encounter and updated as appropriate: medications, allergies, medical history  Review of Systems:  No other skin or systemic complaints except as noted in HPI or Assessment and Plan.  Objective  Well appearing patient in no apparent distress; mood and affect are within normal limits.  A focused examination was performed of the face.  Relevant physical exam findings are noted in the Assessment and Plan.  Injection map photo     Assessment & Plan    Facial Elastosis Botox 28 units injected today to: - Frown complex 20 units - Forehead 8 units Changed the pattern to the forehead, moved the two 1.5 units to medial forehead pattern  Discussed adding Botox comma to L side in the future since increase muscle activity on L comma area  Location: frown complex, forehead  Informed consent: Discussed risks (infection, pain, bleeding, bruising, swelling, allergic reaction, paralysis of nearby muscles, eyelid droop, double vision, neck weakness, difficulty breathing, headache, undesirable cosmetic result, and need for additional treatment) and benefits of the procedure, as well as the alternatives.  Informed consent was obtained.  Preparation: The area was cleansed with alcohol.  Procedure Details:  Botox was injected into the dermis with a 30-gauge needle. Pressure applied to any bleeding. Ice packs offered for swelling.  Lot Number:  [id number] Expiration:  04/2025  Total Units Injected:  28  Plan: Tylenol may be used for headache.  Allow 2 weeks before returning to clinic for additional dosing as needed. Patient will call for any problems.  Return in 5 months (on 11/03/2023) for botox .  I, Ardis Rowan,  RMA, am acting as scribe for Willeen Niece, MD .   Documentation: I have reviewed the above documentation for accuracy and completeness, and I agree with the above.  Willeen Niece, MD

## 2023-07-09 ENCOUNTER — Other Ambulatory Visit: Payer: Self-pay | Admitting: Dermatology

## 2023-07-09 ENCOUNTER — Ambulatory Visit (INDEPENDENT_AMBULATORY_CARE_PROVIDER_SITE_OTHER): Payer: 59 | Admitting: Dermatology

## 2023-07-09 DIAGNOSIS — D1801 Hemangioma of skin and subcutaneous tissue: Secondary | ICD-10-CM

## 2023-07-09 DIAGNOSIS — D2372 Other benign neoplasm of skin of left lower limb, including hip: Secondary | ICD-10-CM

## 2023-07-09 DIAGNOSIS — W908XXA Exposure to other nonionizing radiation, initial encounter: Secondary | ICD-10-CM

## 2023-07-09 DIAGNOSIS — Z1283 Encounter for screening for malignant neoplasm of skin: Secondary | ICD-10-CM

## 2023-07-09 DIAGNOSIS — L578 Other skin changes due to chronic exposure to nonionizing radiation: Secondary | ICD-10-CM | POA: Diagnosis not present

## 2023-07-09 DIAGNOSIS — L814 Other melanin hyperpigmentation: Secondary | ICD-10-CM

## 2023-07-09 DIAGNOSIS — L719 Rosacea, unspecified: Secondary | ICD-10-CM | POA: Diagnosis not present

## 2023-07-09 DIAGNOSIS — D225 Melanocytic nevi of trunk: Secondary | ICD-10-CM

## 2023-07-09 DIAGNOSIS — D239 Other benign neoplasm of skin, unspecified: Secondary | ICD-10-CM

## 2023-07-09 DIAGNOSIS — L988 Other specified disorders of the skin and subcutaneous tissue: Secondary | ICD-10-CM

## 2023-07-09 DIAGNOSIS — D229 Melanocytic nevi, unspecified: Secondary | ICD-10-CM

## 2023-07-09 DIAGNOSIS — Z7189 Other specified counseling: Secondary | ICD-10-CM

## 2023-07-09 DIAGNOSIS — Z86018 Personal history of other benign neoplasm: Secondary | ICD-10-CM

## 2023-07-09 DIAGNOSIS — L821 Other seborrheic keratosis: Secondary | ICD-10-CM

## 2023-07-09 MED ORDER — AZELAIC ACID 15 % EX GEL: CUTANEOUS | 6 refills | Status: DC

## 2023-07-09 MED ORDER — TRETINOIN 0.025 % EX CREA: TOPICAL_CREAM | CUTANEOUS | 6 refills | Status: DC

## 2023-07-09 NOTE — Patient Instructions (Addendum)
 Recommend [place] MD sunscreen products sold  here or can purchase online  Counseling for BBL / IPL / Laser and Coordination of Care Discussed the treatment option of Broad Band Light (BBL) /Intense Pulsed Light (IPL)/ Laser for skin discoloration, including brown spots and redness.  Typically we recommend at least 1-3 treatment sessions about 5-8 weeks apart for best results.  Cannot have tanned skin when BBL performed, and regular use of sunscreen/photoprotection is advised after the procedure to help maintain results. The patient's condition may also require "maintenance treatments" in the future.  The fee for BBL / laser treatments is $350 per treatment session for the whole face.  A fee can be quoted for other parts of the body.  Insurance typically does not pay for BBL/laser treatments and therefore the fee is an out-of-pocket cost. Recommend prophylactic valtrex  treatment. Once scheduled for procedure, will send Rx in prior to patient's appointment.    Topical retinoid medications like tretinoin /Retin-A , adapalene/Differin, tazarotene/Fabior, and Epiduo/Epiduo Forte can cause dryness and irritation when first started. Only apply a pea-sized amount to the entire affected area. Avoid applying it around the eyes, edges of mouth and creases at the nose. If you experience irritation, use a good moisturizer first and/or apply the medicine less often. If you are doing well with the medicine, you can increase how often you use it until you are applying every night. Be careful with sun protection while using this medication as it can make you sensitive to the sun. This medicine should not be used by pregnant women.     Rosacea  What is rosacea? Rosacea (say: ro-zay-sha) is a common skin disease that usually begins as a trend of flushing or blushing easily.  As rosacea progresses, a persistent redness in the center of the face will develop and may gradually spread beyond the nose and cheeks to the forehead  and chin.  In some cases, the ears, chest, and back could be affected.  Rosacea may appear as tiny blood vessels or small red bumps that occur in crops.  Frequently they can contain pus, and are called "pustules".  If the bumps do not contain pus, they are referred to as "papules".  Rarely, in prolonged, untreated cases of rosacea, the oil glands of the nose and cheeks may become permanently enlarged.  This is called rhinophyma, and is seen more frequently in men.  Signs and Risks In its beginning stages, rosacea tends to come and go, which makes it difficult to recognize.  It can start as intermittent flushing of the face.  Eventually, blood vessels may become permanently visible.  Pustules and papules can appear, but can be mistaken for adult acne.  People of all races, ages, genders and ethnic groups are at risk of developing rosacea.  However, it is more common in women (especially around menopause) and adults with fair skin between the ages of 74 and 32.  Treatment Dermatologists typically recommend a combination of treatments to effectively manage rosacea.  Treatment can improve symptoms and may stop the progression of the rosacea.  Treatment may involve both topical and oral medications.  The tetracycline antibiotics are often used for their anti-inflammatory effect; however, because of the possibility of developing antibiotic resistance, they should not be used long term at full dose.  For dilated blood vessels the options include electrodessication (uses electric current through a small needle), laser treatment, and cosmetics to hide the redness.   With all forms of treatment, improvement is a slow process, and  patients may not see any results for the first 3-4 weeks.  It is very important to avoid the sun and other triggers.  Patients must wear sunscreen daily.  Skin Care Instructions: Cleanse the skin with a mild soap such as CeraVe cleanser, Cetaphil cleanser, or Dove soap once or twice daily  as needed. Moisturize with Eucerin Redness Relief Daily Perfecting Lotion (has a subtle green tint), CeraVe Moisturizing Cream, or Oil of Olay Daily Moisturizer with sunscreen every morning and/or night as recommended. Makeup should be "non-comedogenic" (won't clog pores) and be labeled "for sensitive skin". Good choices for cosmetics are: Neutrogena, Almay, and Physician's Formula.  Any product with a green tint tends to offset a red complexion. If your eyes are dry and irritated, use artificial tears 2-3 times per day and cleanse the eyelids daily with baby shampoo.  Have your eyes examined at least every 2 years.  Be sure to tell your eye doctor that you have rosacea. Alcoholic beverages tend to cause flushing of the skin, and may make rosacea worse. Always wear sunscreen, protect your skin from extreme hot and cold temperatures, and avoid spicy foods, hot drinks, and mechanical irritation such as rubbing, scrubbing, or massaging the face.  Avoid harsh skin cleansers, cleansing masks, astringents, and exfoliation. If a particular product burns or makes your face feel tight, then it is likely to flare your rosacea. If you are having difficulty finding a sunscreen that you can tolerate, you may try switching to a chemical-free sunscreen.  These are ones whose active ingredient is zinc oxide or titanium dioxide only.  They should also be fragrance free, non-comedogenic, and labeled for sensitive skin. Rosacea triggers may vary from person to person.  There are a variety of foods that have been reported to trigger rosacea.  Some patients find that keeping a diary of what they were doing when they flared helps them avoid triggers.    Melanoma ABCDEs  Melanoma is the most dangerous type of skin cancer, and is the leading cause of death from skin disease.  You are more likely to develop melanoma if you: Have light-colored skin, light-colored eyes, or red or blond hair Spend a lot of time in the sun Tan  regularly, either outdoors or in a tanning bed Have had blistering sunburns, especially during childhood Have a close family member who has had a melanoma Have atypical moles or large birthmarks  Early detection of melanoma is key since treatment is typically straightforward and cure rates are extremely high if we catch it early.   The first sign of melanoma is often a change in a mole or a new dark spot.  The ABCDE system is a way of remembering the signs of melanoma.  A for asymmetry:  The two halves do not match. B for border:  The edges of the growth are irregular. C for color:  A mixture of colors are present instead of an even brown color. D for diameter:  Melanomas are usually (but not always) greater than 6mm - the size of a pencil eraser. E for evolution:  The spot keeps changing in size, shape, and color.  Please check your skin once per month between visits. You can use a small mirror in front and a large mirror behind you to keep an eye on the back side or your body.   If you see any new or changing lesions before your next follow-up, please call to schedule a visit.  Please continue daily skin protection  including broad spectrum sunscreen SPF 30+ to sun-exposed areas, reapplying every 2 hours as needed when you're outdoors.   Staying in the shade or wearing long sleeves, sun glasses (UVA+UVB protection) and wide brim hats (4-inch brim around the entire circumference of the hat) are also recommended for sun protection.    Due to recent changes in healthcare laws, you may see results of your pathology and/or laboratory studies on MyChart before the doctors have had a chance to review them. We understand that in some cases there may be results that are confusing or concerning to you. Please understand that not all results are received at the same time and often the doctors may need to interpret multiple results in order to provide you with the best plan of care or course of  treatment. Therefore, we ask that you please give us  2 business days to thoroughly review all your results before contacting the office for clarification. Should we see a critical lab result, you will be contacted sooner.   If You Need Anything After Your Visit  If you have any questions or concerns for your doctor, please call our main line at [phone] and press option 4 to reach your doctor's medical assistant. If no one answers, please leave a voicemail as directed and we will return your call as soon as possible. Messages left after 4 pm will be answered the following business day.   You may also send us  a message via MyChart. We typically respond to MyChart messages within 1-2 business days.  For prescription refills, please ask your pharmacy to contact our office. Our fax number is [phone].  If you have an urgent issue when the clinic is closed that cannot wait until the next business day, you can page your doctor at the number below.    Please note that while we do our best to be available for urgent issues outside of office hours, we are not available 24/7.   If you have an urgent issue and are unable to reach us , you may choose to seek medical care at your doctor's office, retail clinic, urgent care center, or emergency room.  If you have a medical emergency, please immediately call 911 or go to the emergency department.  Pager Numbers  - Dr. Bary Likes: [phone]  - Dr. Annette Barters: [phone]  - Dr. Felipe Horton: [phone]   In the event of inclement weather, please call our main line at [phone] for an update on the status of any delays or closures.  Dermatology Medication Tips: Please keep the boxes that topical medications come in in order to help keep track of the instructions about where and how to use these. Pharmacies typically print the medication instructions only on the boxes and not directly on the medication tubes.   If your medication is too expensive,  please contact our office at [phone] option 4 or send us  a message through MyChart.   We are unable to tell what your co-pay for medications will be in advance as this is different depending on your insurance coverage. However, we may be able to find a substitute medication at lower cost or fill out paperwork to get insurance to cover a needed medication.   If a prior authorization is required to get your medication covered by your insurance company, please allow us  1-2 business days to complete this process.  Drug prices often vary depending on where the prescription is filled and some pharmacies may offer cheaper prices.  The website www.goodrx.com contains  coupons for medications through different pharmacies. The prices here do not account for what the cost may be with help from insurance (it may be cheaper with your insurance), but the website can give you the price if you did not use any insurance.  - You can print the associated coupon and take it with your prescription to the pharmacy.  - You may also stop by our office during regular business hours and pick up a GoodRx coupon card.  - If you need your prescription sent electronically to a different pharmacy, notify our office through [facility] or by phone at [phone] option 4.     Si Usted Necesita Algo Despus de Su Visita  Tambin puede enviarnos un mensaje a travs de Clinical cytogeneticist. Por lo general respondemos a los mensajes de MyChart en el transcurso de 1 a 2 das hbiles.  Para renovar recetas, por favor pida a su farmacia que se ponga en contacto con nuestra oficina. Franz Jacks de fax es [place] [phone].  Si tiene un asunto urgente cuando la clnica est cerrada y que no puede esperar hasta el siguiente da hbil, puede llamar/localizar a su doctor(a) al nmero que aparece a continuacin.   Por favor, tenga en cuenta que aunque hacemos todo lo posible para estar disponibles para asuntos urgentes fuera del  horario de [place], no estamos disponibles las 24 horas del da, los 7 [address]  [address] de la [place].   Si tiene un problema urgente y no puede comunicarse con nosotros, puede optar por buscar atencin mdica  en el consultorio de su doctor(a), en una clnica privada, en un centro de atencin urgente o en una sala de emergencias.  Si tiene Engineer, drilling, por favor llame inmediatamente al 911 o vaya a la sala de emergencias.  Nmeros de bper  - Dr. Bary Likes: [phone]  - Dra. Annette Barters: [phone]  - Dr. Felipe Horton: [phone]   En caso de inclemencias del tiempo, por favor llame a Lajuan Pila principal al [phone] para una actualizacin sobre el [place] de cualquier retraso o cierre.  Consejos para la medicacin en dermatologa: Por favor, guarde las cajas en las que vienen los medicamentos de uso tpico para ayudarle a seguir las instrucciones sobre dnde y cmo usarlos. Las farmacias generalmente imprimen las instrucciones del medicamento slo en las cajas y no directamente en los tubos del [place].   Si su medicamento es muy caro, por favor, pngase en contacto con Bettyjane Brunet llamando al [phone] y presione la opcin 4 o envenos un mensaje a travs de Clinical cytogeneticist.   No podemos decirle cul ser su copago por los medicamentos por adelantado ya que esto es diferente dependiendo de la cobertura de su seguro. Sin embargo, es posible que podamos encontrar un medicamento sustituto a Audiological scientist un formulario para que el seguro cubra el medicamento que se considera necesario.   Si se requiere una autorizacin previa para que su compaa de seguros [place] su medicamento, por favor permtanos de 1 a 2 das hbiles para completar este proceso.  Los precios de los medicamentos varan con frecuencia dependiendo del Environmental consultant de dnde se surte la receta y alguna farmacias pueden ofrecer precios ms baratos.  El sitio web www.goodrx.com tiene cupones para medicamentos de Engineer, civil (consulting). Los precios aqu no tienen en cuenta lo que podra costar con la ayuda del seguro (puede ser ms barato con su seguro), pero el sitio web puede darle el precio si no utiliz Tourist information centre manager.  - Puede imprimir el  cupn correspondiente y llevarlo con su receta a la farmacia.  - Tambin puede pasar por nuestra oficina durante el horario de atencin regular y Education officer, museum una tarjeta de cupones de GoodRx.  - Si necesita que su receta se enve electrnicamente a una farmacia diferente, informe a nuestra oficina a travs de MyChart de [place] o por telfono llamando al [phone] y presione la opcin 4.

## 2023-07-09 NOTE — Progress Notes (Signed)
 Follow-Up Visit   Subjective  Kelly [name] is a 38 y.o. female who presents for the following: Skin Cancer Screening and Full Body Skin Exam Hx of dysplastic nevi Spot at left lower leg that she noticed a few weeks ago that did not go away    The patient presents for Total-Body Skin Exam (TBSE) for skin cancer screening and mole check. The patient has spots, moles and lesions to be evaluated, some may be new or changing and the patient may have concern these could be cancer.    The following portions of the chart were reviewed this encounter and updated as appropriate: medications, allergies, medical history  Review of Systems:  No other skin or systemic complaints except as noted in HPI or Assessment and Plan.  Objective  Well appearing patient in no apparent distress; mood and affect are within normal limits.  A full examination was performed including scalp, head, eyes, ears, nose, lips, neck, chest, axillae, abdomen, back, buttocks, bilateral upper extremities, bilateral lower extremities, hands, feet, fingers, toes, fingernails, and toenails. All findings within normal limits unless otherwise noted below.   Relevant physical exam findings are noted in the Assessment and Plan.    Assessment & Plan   SKIN CANCER SCREENING PERFORMED TODAY.  ACTINIC DAMAGE - Chronic condition, secondary to cumulative UV/sun exposure - diffuse scaly erythematous macules with underlying dyspigmentation - Recommend daily broad spectrum sunscreen SPF 30+ to sun-exposed areas, reapply every 2 hours as needed.  - Staying in the shade or wearing long sleeves, sun glasses (UVA+UVB protection) and wide brim hats (4-inch brim around the entire circumference of the hat) are also recommended for sun protection.  - Call for new or changing lesions.  LENTIGINES, SEBORRHEIC KERATOSES, HEMANGIOMAS - Benign normal skin lesions - Benign-appearing - Call for any changes  MELANOCYTIC NEVI - Right  lateral mid upper back - 4 x 2 mm medium dark brown macule  - Tan-brown and/or pink-flesh-colored symmetric macules and papules - Benign appearing on exam today - Observation - Call clinic for new or changing moles - Recommend daily use of broad spectrum spf 30+ sunscreen to sun-exposed areas.   History of Dysplastic Nevus. Left inferior breast, severe atypia. 03/31/2020 excised 06/01/2020 - No evidence of recurrence today - Recommend regular full body skin exams - Recommend daily broad spectrum sunscreen SPF 30+ to sun-exposed areas, reapply every 2 hours as needed.  - Call if any new or changing lesions are noted between office visits   DERMATOFIBROMA VS CYST  Exam:  2.5 mm pink tan firm papule with mild crusting at left ankle   Treatment Plan: A dermatofibroma is a benign growth possibly related to trauma, such as an insect bite, cut from shaving, or inflamed acne-type bump.  Treatment options to remove include shave or excision with resulting scar and risk of recurrence.  Since benign-appearing and not bothersome, will observe for now.   Will continue to watch   ROSACEA Exam Mid face erythema with telangiectasias   Chronic and persistent condition with duration or expected duration over one year. Condition is symptomatic / bothersome to patient. Not to goal.   Rosacea is a chronic progressive skin condition usually affecting the face of adults, causing redness and/or acne bumps. It is treatable but not curable. It sometimes affects the eyes (ocular rosacea) as well. It may respond to topical and/or systemic medication and can flare with stress, sun exposure, alcohol, exercise, topical steroids (including hydrocortisone/cortisone 10) and some foods.  Daily application  of broad spectrum spf 30+ sunscreen to face is recommended to reduce flares.  Patient denies grittiness of the eyes   Treatment Plan  Start Finacea  15 % gel - apply topically qd/bid  Start tretinoin  0.025 % cream -  apply pea sized amount to face nightly as tolerated  Topical retinoid medications like tretinoin /Retin-A , adapalene/Differin, tazarotene/Fabior, and Epiduo/Epiduo Forte can cause dryness and irritation when first started. Only apply a pea-sized amount to the entire affected area. Avoid applying it around the eyes, edges of mouth and creases at the nose. If you experience irritation, use a good moisturizer first and/or apply the medicine less often. If you are doing well with the medicine, you can increase how often you use it until you are applying every night. Be careful with sun protection while using this medication as it can make you sensitive to the sun. This medicine should not be used by pregnant women.    Counseling for BBL / IPL / Laser and Coordination of Care Discussed the treatment option of Broad Band Light (BBL) /Intense Pulsed Light (IPL)/ Laser for skin discoloration, including brown spots and redness.  Typically we recommend at least 1-3 treatment sessions about 5-8 weeks apart for best results.  Cannot have tanned skin when BBL performed, and regular use of sunscreen/photoprotection is advised after the procedure to help maintain results. The patient's condition may also require "maintenance treatments" in the future.  The fee for BBL / laser treatments is $350 per treatment session for the whole face.  A fee can be quoted for other parts of the body.  Insurance typically does not pay for BBL/laser treatments and therefore the fee is an out-of-pocket cost. Recommend prophylactic valtrex  treatment. Once scheduled for procedure, will send Rx in prior to patient's appointment.    FACIAL ELASTOSIS Exam: Rhytides and volume loss.  Treatment Plan: Patient has noticed more movement at left side lower forehead. Upper forehead is very smooth.  Discussed previous botox - good results - may consider adding Botox comma at next follow up.    Recommend daily broad spectrum sunscreen SPF 30+ to  sun-exposed areas, reapply every 2 hours as needed. Call for new or changing lesions.  Staying in the shade or wearing long sleeves, sun glasses (UVA+UVB protection) and wide brim hats (4-inch brim around the entire circumference of the hat) are also recommended for sun protection.    ROSACEA   Related Medications Azelaic Acid  15 % gel After skin is thoroughly washed and patted dry, gently but thoroughly massage a thin film of azelaic acid  cream into the affected area twice daily, in the morning and evening. tretinoin  (RETIN-A ) 0.025 % cream Apply pea sized amount to face qhs as tolerated Return in about 1 year (around 07/08/2024) for TBSE.  I, Randee Busing, CMA, am acting as scribe for Artemio Larry, MD.   Documentation: I have reviewed the above documentation for accuracy and completeness, and I agree with the above.  Artemio Larry, MD

## 2023-07-10 ENCOUNTER — Other Ambulatory Visit: Payer: Self-pay | Admitting: Dermatology

## 2023-07-10 DIAGNOSIS — L719 Rosacea, unspecified: Secondary | ICD-10-CM

## 2023-07-11 ENCOUNTER — Encounter: Payer: Self-pay | Admitting: Dermatology

## 2023-07-11 MED ORDER — TRETINOIN 0.025 % EX CREA: TOPICAL_CREAM | CUTANEOUS | 11 refills | Status: AC

## 2023-07-15 NOTE — Telephone Encounter (Signed)
 Left voicemail for patient to return my call.

## 2023-07-29 ENCOUNTER — Other Ambulatory Visit: Payer: Self-pay

## 2023-07-29 ENCOUNTER — Encounter: Payer: Self-pay | Admitting: Family Medicine

## 2023-07-29 MED ORDER — ARNUITY ELLIPTA 200 MCG/ACT IN AEPB: 1.0000 | INHALATION_SPRAY | Freq: Every day | RESPIRATORY_TRACT | 1 refills | Status: DC

## 2023-08-25 ENCOUNTER — Encounter: Payer: Self-pay | Admitting: Dermatology

## 2023-09-01 NOTE — Progress Notes (Unsigned)
 Follow Up Note  RE: Kelly [name] MRN: [id number] DOB: [birth year] Date of Office Visit: 09/02/2023  Referring provider: Elester Grim, MD Primary care provider: Elester Grim, MD  Chief Complaint: No chief complaint on file.  History of Present Illness: I had the pleasure of seeing Kelly [name] for a follow up visit at the [facility] on 09/02/2023. She is a 38 y.o. female, who is being followed for asthma, allergic rhinitis, CIU, heartburn, atopic dermatitis. Her previous allergy office visit was on 02/04/2023 with Dr. Burdette Carolin. Today is a regular follow up visit.  Discussed the use of AI scribe software for clinical note transcription with the patient, who gave verbal consent to proceed.  History of Present Illness            ***  Assessment and Plan: Kelly [name] is a 38 y.o. female with: Not well controlled moderate persistent asthma Improvement in wheezing with Arnuity 100mcg daily. Occasional use of Levalbuterol  2-3 times per week. Patient reports no significant relief with Levalbuterol . Today's spirometry was norma - improved from prior one. Daily controller medication(s): Increase Arnuity 200mcg 1 puff once a day and rinse mouth after each use x 2 months.  If no improvement in symptoms will step down to 100mcg dose.  May use levoalbuterol rescue inhaler 2 puffs every 4 to 6 hours as needed for shortness of breath, chest tightness, coughing, and wheezing. May use levoalbuterol rescue inhaler 2 puffs 5 to 15 minutes prior to strenuous physical activities. Monitor frequency of use - if you need to use it more than twice per week on a consistent basis let us  know.  Get spirometry at next visit.   Seasonal allergic rhinitis due to pollen Past history - 2024 bloodwork positive to ragweed and borderline to grass. Interim history - controlled.  Continue Zyrtec  (cetirizine ) daily. May take twice a day during allergy flares. Use Flonase  (fluticasone )  nasal spray 1-2 sprays per nostril once a day as needed for nasal congestion.  Nasal saline spray (i.e., Simply Saline) or nasal saline lavage (i.e., NeilMed) is recommended as needed and prior to medicated nasal sprays.   Chronic idiopathic urticaria Controlled with daily Zyrtec . Significant flare when medication is missed. Continue zyrtec  (cetirizine ) 10mg  daily as above.  Avoid the following potential triggers: alcohol, tight clothing, NSAIDs, hot showers and getting overheated. Continue proper skin care.    Heartburn Not sure how much famotidine  is helping.  See handout for lifestyle and dietary modifications. Only use famotidine  20mg  twice a day if needed. If you notice increased itching/rash/hives then okay to restart.     Other atopic dermatitis Continue proper skin care.  Assessment and Plan              No follow-ups on file.  No orders of the defined types were placed in this encounter.  Lab Orders  No laboratory test(s) ordered today    Diagnostics: Spirometry:  Tracings reviewed. Her effort: {Blank single:19197::Good reproducible efforts.,It was hard to get consistent efforts and there is a question as to whether this reflects a maximal maneuver.,Poor effort, data can not be interpreted.} FVC: ***L FEV1: ***L, ***% predicted FEV1/FVC ratio: ***% Interpretation: {Blank single:19197::Spirometry consistent with mild obstructive disease,Spirometry consistent with moderate obstructive disease,Spirometry consistent with severe obstructive disease,Spirometry consistent with possible restrictive disease,Spirometry consistent with mixed obstructive and restrictive disease,Spirometry uninterpretable due to technique,Spirometry consistent with normal pattern,No overt abnormalities noted given today's efforts}.  Please see scanned spirometry results for details.  Skin Testing: {Blank single:19197::Select foods,Environmental allergy  panel,Environmental allergy panel and select foods,Food allergy panel,None,Deferred due to recent antihistamines use}. *** Results discussed with patient/family.   Medication List:  Current Outpatient Medications  Medication Sig Dispense Refill   Azelaic Acid  15 % gel After skin is thoroughly washed and patted dry, gently but thoroughly massage a thin film of azelaic acid  cream into the affected area twice daily, in the morning and evening. 30 g 6   buPROPion (WELLBUTRIN XL) 150 MG 24 hr tablet 1 tablet in the morning Orally Once a day for 90 days     cetirizine  (ZYRTEC ) 10 MG tablet TAKE 1 TABLET (10 MG TOTAL) BY MOUTH 2 (TWO) TIMES DAILY AS NEEDED FOR ALLERGIES. 180 tablet 1   famotidine  (PEPCID ) 20 MG tablet Take 1 tablet (20 mg total) by mouth 2 (two) times daily. 60 tablet 5   fluticasone  (FLONASE ) 50 MCG/ACT nasal spray SPRAY 2 SPRAYS INTO EACH NOSTRIL EVERY DAY 48 mL 1   Fluticasone  Furoate (ARNUITY ELLIPTA ) 200 MCG/ACT AEPB Inhale 1 puff into the lungs daily. Rinse mouth out after each use. 30 each 1   levalbuterol  (XOPENEX  HFA) 45 MCG/ACT inhaler INHALE 2 PUFFS INTO THE LUNGS EVERY 4 HOURS AS NEEDED FOR WHEEZE 15 each 1   levonorgestrel (MIRENA, 52 MG,) 20 MCG/DAY IUD 1 each by Intrauterine route once.     Magnesium  250 MG TABS 1 tablet with a meal Orally Once a day     metoprolol tartrate (LOPRESSOR) 25 MG tablet Take by mouth.     tretinoin  (RETIN-A ) 0.025 % cream Apply a pea sized amount to the entire face QHS. 45 g 11   No current facility-administered medications for this visit.   Allergies: No Known Allergies I reviewed her past medical history, social history, family history, and environmental history and no significant changes have been reported from her previous visit.  Review of Systems  Constitutional:  Negative for appetite change, chills, fever and unexpected weight change.  HENT:  Negative for congestion and rhinorrhea.   Eyes:  Negative for itching.   Respiratory:  Positive for cough and wheezing. Negative for chest tightness and shortness of breath.   Cardiovascular:  Negative for chest pain.  Gastrointestinal:  Negative for abdominal pain.  Genitourinary:  Negative for difficulty urinating.  Skin:  Negative for rash.  Allergic/Immunologic: Positive for environmental allergies.  Neurological:  Negative for headaches.    Objective: There were no vitals taken for this visit. There is no height or weight on file to calculate BMI. Physical Exam Vitals and nursing note reviewed.  Constitutional:      Appearance: Normal appearance. She is well-developed.  HENT:     Head: Normocephalic and atraumatic.     Right Ear: Tympanic membrane and external ear normal.     Left Ear: Tympanic membrane and external ear normal.     Nose: Nose normal.     Mouth/Throat:     Mouth: Mucous membranes are moist.     Pharynx: Oropharynx is clear.   Eyes:     Conjunctiva/sclera: Conjunctivae normal.    Cardiovascular:     Rate and Rhythm: Normal rate and regular rhythm.     Heart sounds: Normal heart sounds. No murmur heard.    No friction rub. No gallop.  Pulmonary:     Effort: Pulmonary effort is normal.     Breath sounds: Normal breath sounds. No wheezing, rhonchi or rales.   Musculoskeletal:     Cervical back: Neck  supple.   Skin:    General: Skin is warm.     Findings: No rash.   Neurological:     Mental Status: She is alert and oriented to person, place, and time.   Psychiatric:        Behavior: Behavior normal.    Previous notes and tests were reviewed. The plan was reviewed with the patient/family, and all questions/concerned were addressed.  It was my pleasure to see Kelly [name] today and participate in her care. Please feel free to contact me with any questions or concerns.  Sincerely,  Eudelia Hero, DO Allergy & Immunology  [facility]  [place] office: [phone] [facility] office:  [phone]

## 2023-09-02 ENCOUNTER — Encounter: Payer: Self-pay | Admitting: Allergy

## 2023-09-02 ENCOUNTER — Other Ambulatory Visit: Payer: Self-pay | Admitting: Allergy

## 2023-09-02 ENCOUNTER — Ambulatory Visit (INDEPENDENT_AMBULATORY_CARE_PROVIDER_SITE_OTHER): Admitting: Allergy

## 2023-09-02 ENCOUNTER — Other Ambulatory Visit: Payer: Self-pay

## 2023-09-02 VITALS — BP 130/80 | HR 58 | Temp 97.9°F | Resp 18 | Ht 62.5 in | Wt 153.2 lb

## 2023-09-02 DIAGNOSIS — J301 Allergic rhinitis due to pollen: Secondary | ICD-10-CM

## 2023-09-02 DIAGNOSIS — L501 Idiopathic urticaria: Secondary | ICD-10-CM | POA: Diagnosis not present

## 2023-09-02 DIAGNOSIS — J4530 Mild persistent asthma, uncomplicated: Secondary | ICD-10-CM | POA: Diagnosis not present

## 2023-09-02 DIAGNOSIS — K219 Gastro-esophageal reflux disease without esophagitis: Secondary | ICD-10-CM | POA: Diagnosis not present

## 2023-09-02 MED ORDER — FLUTICASONE PROPIONATE 50 MCG/ACT NA SUSP: 1.0000 | Freq: Every day | NASAL | 5 refills | Status: AC | PRN

## 2023-09-02 MED ORDER — ARNUITY ELLIPTA 200 MCG/ACT IN AEPB: 1.0000 | INHALATION_SPRAY | Freq: Every day | RESPIRATORY_TRACT | 5 refills | Status: DC

## 2023-09-02 NOTE — Patient Instructions (Addendum)
 Breathing  Daily controller medication(s): Arnuity 200mcg 1 puff once a day and rinse mouth after each use.  May use levoalbuterol rescue inhaler 2 puffs every 4 to 6 hours as needed for shortness of breath, chest tightness, coughing, and wheezing. May use levoalbuterol rescue inhaler 2 puffs 5 to 15 minutes prior to strenuous physical activities. Monitor frequency of use - if you need to use it more than twice per week on a consistent basis let us  know.  Breathing control goals:  Full participation in all desired activities (may need albuterol before activity) Albuterol use two times or less a week on average (not counting use with activity) Cough interfering with sleep two times or less a month Oral steroids no more than once a year No hospitalizations   Allergic rhinitis 2024 bloodwork positive to ragweed and borderline to grass. Continue Zyrtec  (cetirizine ) daily. May take twice a day during allergy flares. Use Flonase  (fluticasone ) nasal spray 1-2 sprays per nostril once a day as needed for nasal congestion.  Use it on Mondays/Wednesdays/Fridays. Try to use only 1 spray per nostril once a day.  Nasal saline spray (i.e., Simply Saline) or nasal saline lavage (i.e., NeilMed) is recommended as needed and prior to medicated nasal sprays.  Urticaria Continue zyrtec  (cetirizine ) 10mg  daily as above.  Avoid the following potential triggers: alcohol, tight clothing, NSAIDs, hot showers and getting overheated. Continue proper skin care.   Reflux Continue lifestyle and dietary modifications. Only use famotidine  20mg  twice a day if needed. If you notice increased itching/rash/hives then okay to restart.    Follow up in 4 months or sooner if needed.   Skin care recommendations  Bath time: Always use lukewarm water. AVOID very hot or cold water. Keep bathing time to 5-10 minutes. Do NOT use bubble bath. Use a mild soap and use just enough to wash the dirty areas. Do NOT scrub skin  vigorously.  After bathing, pat dry your skin with a towel. Do NOT rub or scrub the skin.  Moisturizers and prescriptions:  ALWAYS apply moisturizers immediately after bathing (within 3 minutes). This helps to lock-in moisture. Use the moisturizer several times a day over the whole body. Good summer moisturizers include: Aveeno, CeraVe, Cetaphil. Good winter moisturizers include: Aquaphor, Vaseline, Cerave, Cetaphil, Eucerin, Vanicream. When using moisturizers along with medications, the moisturizer should be applied about one hour after applying the medication to prevent diluting effect of the medication or moisturize around where you applied the medications. When not using medications, the moisturizer can be continued twice daily as maintenance.  Laundry and clothing: Avoid laundry products with added color or perfumes. Use unscented hypo-allergenic laundry products such as Tide free, Cheer free & gentle, and All free and clear.  If the skin still seems dry or sensitive, you can try double-rinsing the clothes. Avoid tight or scratchy clothing such as wool. Do not use fabric softeners or dyer sheets.  Reducing Pollen Exposure Pollen seasons: trees (spring), grass (summer) and ragweed/weeds (fall). Keep windows closed in your home and car to lower pollen exposure.  Install air conditioning in the bedroom and throughout the house if possible.  Avoid going out in dry windy days - especially early morning. Pollen counts are highest between 5 - 10 AM and on dry, hot and windy days.  Save outside activities for late afternoon or after a heavy rain, when pollen levels are lower.  Avoid mowing of grass if you have grass pollen allergy. Be aware that pollen can also be transported  indoors on people and pets.  Dry your clothes in an automatic dryer rather than hanging them outside where they might collect pollen.  Rinse hair and eyes before bedtime.

## 2023-09-25 ENCOUNTER — Other Ambulatory Visit: Payer: Self-pay | Admitting: Obstetrics and Gynecology

## 2023-09-25 DIAGNOSIS — Z1231 Encounter for screening mammogram for malignant neoplasm of breast: Secondary | ICD-10-CM

## 2023-10-31 ENCOUNTER — Other Ambulatory Visit: Payer: Self-pay | Admitting: Allergy

## 2023-11-04 ENCOUNTER — Other Ambulatory Visit: Payer: Self-pay | Admitting: Allergy

## 2023-11-04 NOTE — Telephone Encounter (Signed)
 Please call patient and let her know that her insurance is denying arnuity.  Sending in Flovent  220mcg 2 puffs BID.  If this is not covered let us  know and make a sooner follow up appointment.

## 2023-11-04 NOTE — Telephone Encounter (Signed)
 Spoke with patient to inform her of the change. She verbally okay.

## 2023-11-05 ENCOUNTER — Other Ambulatory Visit (HOSPITAL_COMMUNITY): Payer: Self-pay

## 2023-11-05 ENCOUNTER — Telehealth: Payer: Self-pay

## 2023-11-05 NOTE — Telephone Encounter (Signed)
*  [facility]  Pharmacy Patient Advocate Encounter   Received notification from RX Request Messages that prior authorization for fluticasone  (FLOVENT  HFA) 220 MCG/ACT inhaler  is required/requested.   Insurance verification completed.   The patient is insured through [facility] .   Per test claim:  Arnuity or Qvar is preferred by the insurance.  If suggested medication is appropriate, Please send in a new RX and discontinue this one. If not, please advise as to why it's not appropriate so that we may request a Prior Authorization. Please note, some preferred medications may still require a PA.  If the suggested medications have not been trialed and there are no contraindications to their use, the PA will not be submitted, as it will not be approved.   Brand Arnuity- $0.00 Brand Qvar- $0.00

## 2023-11-06 ENCOUNTER — Encounter: Payer: Self-pay | Admitting: Allergy

## 2023-11-08 MED ORDER — ARNUITY ELLIPTA 200 MCG/ACT IN AEPB: 1.0000 | INHALATION_SPRAY | Freq: Every day | RESPIRATORY_TRACT | 3 refills | Status: AC

## 2023-11-08 NOTE — Telephone Encounter (Signed)
 Please all patient.   I sent in arnuity 200mcg 1 puff once a day.  When I tried to send it in previously - the insurance was denying it and that is why I sent in Flovent  as they said that was their preferred option.   I'm not sure what's going on with the insurance as they keep going back and forth what is covered.

## 2023-11-11 ENCOUNTER — Ambulatory Visit (INDEPENDENT_AMBULATORY_CARE_PROVIDER_SITE_OTHER): Payer: Self-pay | Admitting: Dermatology

## 2023-11-11 ENCOUNTER — Encounter: Payer: Self-pay | Admitting: Dermatology

## 2023-11-11 DIAGNOSIS — L988 Other specified disorders of the skin and subcutaneous tissue: Secondary | ICD-10-CM

## 2023-11-11 NOTE — Progress Notes (Signed)
   Follow-Up Visit   Subjective  Kelly [name] is a 38 y.o. female who presents for the following: Botox for facial elastosis  The following portions of the chart were reviewed this encounter and updated as appropriate: medications, allergies, medical history  Review of Systems:  No other skin or systemic complaints except as noted in HPI or Assessment and Plan.  Objective  Well appearing patient in no apparent distress; mood and affect are within normal limits.  A focused examination was performed of the face.  Relevant physical exam findings are noted in the Assessment and Plan.  Injection map photo     Assessment & Plan    Facial Elastosis Botox 28 units injected today to: - Frown Complex 20 units - Forehead 8 units  Discussed adding 1.25 u botox to L botox comma, may consider in future  Location: frown complex, forehead  Informed consent: Discussed risks (infection, pain, bleeding, bruising, swelling, allergic reaction, paralysis of nearby muscles, eyelid droop, double vision, neck weakness, difficulty breathing, headache, undesirable cosmetic result, and need for additional treatment) and benefits of the procedure, as well as the alternatives.  Informed consent was obtained.  Preparation: The area was cleansed with alcohol.  Procedure Details:  Botox was injected into the dermis with a 30-gauge needle. Pressure applied to any bleeding. Ice packs offered for swelling.  Lot Number:  [id number] Expiration:  07/2025  Total Units Injected:  28  Plan: Tylenol  may be used for headache.  Allow 2 weeks before returning to clinic for additional dosing as needed. Patient will call for any problems.  Return in 5 months (on 04/12/2024) for Botox.  I, Grayce Saunas, RMA, am acting as scribe for Rexene Rattler, MD .   Documentation: I have reviewed the above documentation for accuracy and completeness, and I agree with the above.  Rexene Rattler, MD

## 2023-11-11 NOTE — Patient Instructions (Signed)

## 2023-11-18 ENCOUNTER — Other Ambulatory Visit: Payer: Self-pay | Admitting: Medical Genetics

## 2023-11-25 ENCOUNTER — Ambulatory Visit
Admission: RE | Admit: 2023-11-25 | Discharge: 2023-11-25 | Disposition: A | Discharge status: Home / Self Care | Source: Ambulatory Visit | Attending: Obstetrics and Gynecology | Admitting: Obstetrics and Gynecology

## 2023-11-25 DIAGNOSIS — Z1231 Encounter for screening mammogram for malignant neoplasm of breast: Secondary | ICD-10-CM

## 2023-12-31 ENCOUNTER — Other Ambulatory Visit: Payer: Self-pay

## 2023-12-31 ENCOUNTER — Other Ambulatory Visit: Payer: Self-pay | Admitting: Allergy & Immunology

## 2023-12-31 ENCOUNTER — Ambulatory Visit: Admitting: Allergy & Immunology

## 2023-12-31 ENCOUNTER — Encounter: Payer: Self-pay | Admitting: Allergy & Immunology

## 2023-12-31 VITALS — BP 122/88 | HR 65 | Temp 98.2°F | Resp 16 | Ht 64.0 in | Wt 149.7 lb

## 2023-12-31 DIAGNOSIS — J4530 Mild persistent asthma, uncomplicated: Secondary | ICD-10-CM | POA: Diagnosis not present

## 2023-12-31 DIAGNOSIS — L501 Idiopathic urticaria: Secondary | ICD-10-CM

## 2023-12-31 DIAGNOSIS — K219 Gastro-esophageal reflux disease without esophagitis: Secondary | ICD-10-CM | POA: Diagnosis not present

## 2023-12-31 DIAGNOSIS — J301 Allergic rhinitis due to pollen: Secondary | ICD-10-CM | POA: Diagnosis not present

## 2023-12-31 MED ORDER — FLUTICASONE PROPIONATE HFA 110 MCG/ACT IN AERO: 2.0000 | INHALATION_SPRAY | Freq: Every morning | RESPIRATORY_TRACT | 5 refills | Status: DC

## 2023-12-31 NOTE — Patient Instructions (Addendum)
 1. Mild persistent asthma without complication - Lung testing looks awesome today.  - We are going to try to use Flovent  instead of Arnuity (this is a metered dose inhaler and might be nicer on your throat). - Use the three finger technique to give yourself the Flovent  (you should have a spacer, but I hate to increase the cost that you pay out of pocket)  Three finger technique: Using a spacer is the BEST for medication delivery. But if you do not have a spacer, you can use 3 fingers to make space between the end of the inhaler where the medication comes out and your mouth. Open you mouth and then start inhaling. Press the inhaler and keep breathing in for a few more seconds. Then hold your breath for 5 seconds. Repeat for the prescribed number of inhalations.   - Daily controller medication(s): Flovent  2 puffs once daily with spacer - Prior to physical activity: albuterol 2 puffs 10-15 minutes before physical activity. - Rescue medications: albuterol 4 puffs every 4-6 hours as needed - Asthma control goals:  * Full participation in all desired activities (may need albuterol before activity) * Albuterol use two time or less a week on average (not counting use with activity) * Cough interfering with sleep two time or less a month * Oral steroids no more than once a year * No hospitalizations  2. Seasonal allergic rhinitis due to pollen (ragweed, grasses) - Continue with cetirizine  10mg  daily. - OK to stop the Flonase  since you are fine without it.  - You can make it as needed.   3. Chronic idiopathic urticaria - Continue with the cetirizine  daily.   4. Gastroesophageal reflux disease - Continue with Pepcid  (famotidine ) 20mg  1-2 times daily as needed.   5. Return in about 6 months (around 06/30/2024). You can have the follow up appointment with Dr. Iva or a Nurse Practicioner (our Nurse Practitioners are excellent and always have Physician oversight!).    Please inform us  of  any Emergency Department visits, hospitalizations, or changes in symptoms. Call us  before going to the ED for breathing or allergy symptoms since we might be able to fit you in for a sick visit. Feel free to contact us  anytime with any questions, problems, or concerns.  It was a pleasure to meet you today!  Websites that have reliable patient information: 1. American Academy of Asthma, Allergy, and Immunology: www.aaaai.org 2. Food Allergy Research and Education (FARE): foodallergy.org 3. Mothers of Asthmatics: http://www.asthmacommunitynetwork.org 4. American College of Allergy, Asthma, and Immunology: www.acaai.org      "Like" us  on Facebook and Instagram for our latest updates!      A healthy democracy works best when [employer] participate! Make sure you are registered to vote! If you have moved or changed any of your contact information, you will need to get this updated before voting! Scan the QR codes below to learn more!

## 2023-12-31 NOTE — Progress Notes (Signed)
 FOLLOW UP  Date of Service/Encounter:  12/31/23   Assessment:   Seasonal allergic rhinitis due to pollen  Mild persistent asthma without complication  Chronic idiopathic urticaria  Gastroesophageal reflux disease  Plan/Recommendations:   1. Mild persistent asthma without complication - Lung testing looks awesome today.  - We are going to try to use Flovent  instead of Arnuity (this is a metered dose inhaler and might be nicer on your throat). - Use the three finger technique to give yourself the Flovent  (you should have a spacer, but I hate to increase the cost that you pay out of pocket)  Three finger technique: Using a spacer is the BEST for medication delivery. But if you do not have a spacer, you can use 3 fingers to make space between the end of the inhaler where the medication comes out and your mouth. Open you mouth and then start inhaling. Press the inhaler and keep breathing in for a few more seconds. Then hold your breath for 5 seconds. Repeat for the prescribed number of inhalations. - Daily controller medication(s): Flovent  2 puffs once daily with spacer - Prior to physical activity: albuterol 2 puffs 10-15 minutes before physical activity. - Rescue medications: albuterol 4 puffs every 4-6 hours as needed - Asthma control goals:  * Full participation in all desired activities (may need albuterol before activity) * Albuterol use two time or less a week on average (not counting use with activity) * Cough interfering with sleep two time or less a month * Oral steroids no more than once a year * No hospitalizations  2. Seasonal allergic rhinitis due to pollen (ragweed, grasses) - Continue with cetirizine  10mg  daily. - OK to stop the Flonase  since you are fine without it.  - You can make it as needed.   3. Chronic idiopathic urticaria - Continue with the cetirizine  daily.   4. Gastroesophageal reflux disease - Continue with Pepcid  (famotidine ) 20mg  1-2 times  daily as needed.   5. Return in about 6 months (around 06/30/2024). You can have the follow up appointment with Dr. Iva or a Nurse Practicioner (our Nurse Practitioners are excellent and always have Physician oversight!).   Subjective:   Kelly [name] is a 38 y.o. female presenting today for follow up of  Chief Complaint  Patient presents with   Follow-up    Kelly [name] has a history of the following: Patient Active Problem List   Diagnosis Date Noted   Gastroesophageal reflux disease 11/01/2022   Pruritus 05/18/2022   Chronic rhinitis 05/18/2022   Urticaria 05/02/2022   Other seasonal allergic rhinitis 05/02/2022   Wheezing 05/02/2022   Intrinsic atopic dermatitis 05/02/2022   Abdominal pain 06/18/2018   Preterm labor without delivery, third trimester 03/26/2016   Preterm uterine contractions in third trimester, antepartum 02/01/2016   Postpartum state 10/26/2014   Normal labor and delivery 10/25/2014   [redacted] weeks gestation of pregnancy    Preterm labor in third trimester without delivery 09/14/2014    History obtained from: chart review and patient.  Discussed the use of AI scribe software for clinical note transcription with the patient and/or guardian, who gave verbal consent to proceed.  Kelly [name] is a 38 y.o. female presenting for a follow up visit.  She was last seen in June 2025 by Dr. Luke.  At that time, she seemed to be doing better with Arnuity 200 mcg 1 puff daily.  For her allergic rhinitis, she has been positive to ragweed and grass in the  past.  She was continued on Zyrtec  daily as well as Flonase  3 times a week.  Her hives are under good control with the Zyrtec .  Reflux was controlled with famotidine  twice daily.  Since last visit, she has done very well.  Asthma/Respiratory Symptom History: She is using Arnuity once daily for asthma management and does not pay anything out of pocket for it. She experiences a mild cough, which she attributes to  either allergies or the medication itself. She has not had asthma since childhood but was diagnosed around age 11. Her mother also has asthma. The Arnuity seems to be working well for her. No significant issues with asthma at night, occasionally wakes up coughing but it does not disturb her sleep significantly.   Allergic Rhinitis Symptom History: She has not used Flonase  for about a week due to running out of the medication. She is currently taking Zyrtec  daily to prevent hives, as she experiences rebound hives if she discontinues it for a few days. She purchases Zyrtec  over the counter.  GERD Symptom History: She occasionally uses Pepcid  for reflux symptoms, which she experiences as a burning sensation. She takes Pepcid  approximately every other day, primarily at night, based on her symptoms.  She lives in [place] and commutes about 40 minutes to work. She works as a Engineer, civil (consulting) at [employer] and has two children, ages 48 and 50, who attend [employer].  Otherwise, there have been no changes to her past medical history, surgical history, family history, or social history.    Review of systems otherwise negative other than that mentioned in the HPI.    Objective:   Blood pressure 122/88, pulse 65, temperature 98.2 F (36.8 C), temperature source Temporal, resp. rate 16, height 5' 4 (1.626 m), weight 149 lb 11.2 oz (67.9 kg), SpO2 95%, unknown if currently breastfeeding. Body mass index is 25.7 kg/m.    Physical Exam Vitals reviewed.  Constitutional:      Appearance: She is well-developed.  HENT:     Head: Normocephalic and atraumatic.     Right Ear: Tympanic membrane, ear canal and external ear normal.     Left Ear: Tympanic membrane, ear canal and external ear normal.     Nose: No nasal deformity, septal deviation, mucosal edema or rhinorrhea.     Right Turbinates: Enlarged, swollen and pale.     Left Turbinates: Enlarged, swollen and pale.     Right  Sinus: No maxillary sinus tenderness or frontal sinus tenderness.     Left Sinus: No maxillary sinus tenderness or frontal sinus tenderness.     Mouth/Throat:     Mouth: Mucous membranes are not pale and not dry.     Pharynx: Uvula midline.  Eyes:     General: Lids are normal. Allergic shiner present.        Right eye: No discharge.        Left eye: No discharge.     Conjunctiva/sclera: Conjunctivae normal.     Right eye: Right conjunctiva is not injected. No chemosis.    Left eye: Left conjunctiva is not injected. No chemosis.    Pupils: Pupils are equal, round, and reactive to light.  Cardiovascular:     Rate and Rhythm: Normal rate and regular rhythm.     Heart sounds: Normal heart sounds.  Pulmonary:     Effort: Pulmonary effort is normal. No tachypnea, accessory muscle usage or respiratory distress.     Breath sounds: Normal breath sounds. No wheezing,  rhonchi or rales.     Comments: Moving air well in all lung fields.  Chest:     Chest wall: No tenderness.  Lymphadenopathy:     Cervical: No cervical adenopathy.  Skin:    General: Skin is warm.     Capillary Refill: Capillary refill takes less than 2 seconds.     Coloration: Skin is not pale.     Findings: No abrasion, erythema, petechiae or rash. Rash is not papular, urticarial or vesicular.     Comments: No eczematous patches noted.   Neurological:     Mental Status: She is alert.  Psychiatric:        Behavior: Behavior is cooperative.      Diagnostic studies:    Spirometry: results normal (FEV1: 2.25/77%, FVC: 3.08/87%, FEV1/FVC: 73%).    Spirometry consistent with normal pattern.   Allergy Studies: none       Marty Shaggy, MD  [facility] 

## 2024-01-01 ENCOUNTER — Encounter: Payer: Self-pay | Admitting: Allergy & Immunology

## 2024-01-01 ENCOUNTER — Ambulatory Visit: Admitting: Allergy

## 2024-01-02 ENCOUNTER — Other Ambulatory Visit: Payer: Self-pay | Admitting: Allergy & Immunology

## 2024-01-02 MED ORDER — FLUTICASONE PROPIONATE HFA 110 MCG/ACT IN AERO: INHALATION_SPRAY | RESPIRATORY_TRACT | 5 refills | Status: AC

## 2024-01-14 ENCOUNTER — Encounter: Payer: Self-pay | Admitting: Allergy & Immunology

## 2024-01-15 MED ORDER — QVAR REDIHALER 80 MCG/ACT IN AERB: 2.0000 | INHALATION_SPRAY | Freq: Two times a day (BID) | RESPIRATORY_TRACT | 5 refills | Status: AC

## 2024-03-03 ENCOUNTER — Encounter: Payer: Self-pay | Admitting: Dermatology

## 2024-03-10 ENCOUNTER — Encounter: Payer: Self-pay | Admitting: Dermatology

## 2024-03-10 ENCOUNTER — Ambulatory Visit: Admitting: Dermatology

## 2024-03-10 ENCOUNTER — Ambulatory Visit (INDEPENDENT_AMBULATORY_CARE_PROVIDER_SITE_OTHER): Admitting: Dermatology

## 2024-03-10 DIAGNOSIS — L309 Dermatitis, unspecified: Secondary | ICD-10-CM

## 2024-03-10 DIAGNOSIS — L308 Other specified dermatitis: Secondary | ICD-10-CM | POA: Diagnosis not present

## 2024-03-10 MED ORDER — CLOBETASOL PROPIONATE 0.05 % EX OINT: TOPICAL_OINTMENT | CUTANEOUS | 5 refills | Status: AC

## 2024-03-10 NOTE — Patient Instructions (Addendum)
 Topical steroids (such as triamcinolone , fluocinolone, fluocinonide, mometasone, clobetasol , halobetasol, betamethasone, hydrocortisone) can cause thinning and lightening of the skin if they are used for too long in the same area. Your physician has selected the right strength medicine for your problem and area affected on the body. Please use your medication only as directed by your physician to prevent side effects.    Gentle Skin Care Guide  1. Bathe no more than once a day.  2. Avoid bathing in hot water  3. Use a mild soap like Dove, Vanicream, Cetaphil, CeraVe. Can use Lever 2000 or Cetaphil antibacterial soap  4. Use soap only where you need it. On most days, use it under your arms, between your legs, and on your feet. Let the water rinse other areas unless visibly dirty.  5. When you get out of the bath/shower, use a towel to gently blot your skin dry, don't rub it.  6. While your skin is still a little damp, apply a moisturizing cream such as Vanicream, CeraVe, Cetaphil, Eucerin, Sarna lotion or plain Vaseline Jelly. For hands apply Neutrogena [place] Hand Cream or Excipial Hand Cream.  7. Reapply moisturizer any time you start to itch or feel dry.  8. Sometimes using free and clear laundry detergents can be helpful. Fabric softener sheets should be avoided. Downy Free & Gentle liquid, or any liquid fabric softener that is free of dyes and perfumes, it acceptable to use  9. If your doctor has given you prescription creams you may apply moisturizers over them      Due to recent changes in healthcare laws, you may see results of your pathology and/or laboratory studies on MyChart before the doctors have had a chance to review them. We understand that in some cases there may be results that are confusing or concerning to you. Please understand that not all results are received at the same time and often the doctors may need to interpret multiple results in order to provide you with  the best plan of care or course of treatment. Therefore, we ask that you please give us  2 business days to thoroughly review all your results before contacting the office for clarification. Should we see a critical lab result, you will be contacted sooner.   If You Need Anything After Your Visit  If you have any questions or concerns for your doctor, please call our main line at [phone] and press option 4 to reach your doctor's medical assistant. If no one answers, please leave a voicemail as directed and we will return your call as soon as possible. Messages left after 4 pm will be answered the following business day.   You may also send us  a message via MyChart. We typically respond to MyChart messages within 1-2 business days.  For prescription refills, please ask your pharmacy to contact our office. Our fax number is [phone].  If you have an urgent issue when the clinic is closed that cannot wait until the next business day, you can page your doctor at the number below.    Please note that while we do our best to be available for urgent issues outside of office hours, we are not available 24/7.   If you have an urgent issue and are unable to reach us , you may choose to seek medical care at your doctor's office, retail clinic, urgent care center, or emergency room.  If you have a medical emergency, please immediately call 911 or go to the emergency department.  Pager Numbers  - Dr. Hester: [phone]  - Dr. Jackquline: [phone]  - Dr. Claudene: [phone]   - Dr. Raymund: [phone]  In the event of inclement weather, please call our main line at [phone] for an update on the status of any delays or closures.  Dermatology Medication Tips: Please keep the boxes that topical medications come in in order to help keep track of the instructions about where and how to use these. Pharmacies typically print the medication instructions only on the boxes and not directly on  the medication tubes.   If your medication is too expensive, please contact our office at [phone] option 4 or send us  a message through MyChart.   We are unable to tell what your co-pay for medications will be in advance as this is different depending on your insurance coverage. However, we may be able to find a substitute medication at lower cost or fill out paperwork to get insurance to cover a needed medication.   If a prior authorization is required to get your medication covered by your insurance company, please allow us  1-2 business days to complete this process.  Drug prices often vary depending on where the prescription is filled and some pharmacies may offer cheaper prices.  The website www.goodrx.com contains coupons for medications through different pharmacies. The prices here do not account for what the cost may be with help from insurance (it may be cheaper with your insurance), but the website can give you the price if you did not use any insurance.  - You can print the associated coupon and take it with your prescription to the pharmacy.  - You may also stop by our office during regular business hours and pick up a GoodRx coupon card.  - If you need your prescription sent electronically to a different pharmacy, notify our office through [facility] or by phone at [phone] option 4.     Si Usted Necesita Algo Despus de Su Visita  Tambin puede enviarnos un mensaje a travs de Clinical cytogeneticist. Por lo general respondemos a los mensajes de MyChart en el transcurso de 1 a 2 das hbiles.  Para renovar recetas, por favor pida a su farmacia que se ponga en contacto con nuestra oficina. Randi lakes de fax es [place] [phone].  Si tiene un asunto urgente cuando la clnica est cerrada y que no puede esperar hasta el siguiente da hbil, puede llamar/localizar a su doctor(a) al nmero que aparece a continuacin.   Por favor, tenga en cuenta que aunque hacemos todo lo  posible para estar disponibles para asuntos urgentes fuera del horario de [place], no estamos disponibles las 24 horas del da, los 7 [address]  [address] de la [place].   Si tiene un problema urgente y no puede comunicarse con nosotros, puede optar por buscar atencin mdica  en el consultorio de su doctor(a), en una clnica privada, en un centro de atencin urgente o en una sala de emergencias.  Si tiene Engineer, drilling, por favor llame inmediatamente al 911 o vaya a la sala de emergencias.  Nmeros de bper  - Dr. Hester: [phone]  - Dra. Jackquline: [phone]  - Dr. Claudene: [phone]  - Dra. Kitts: [phone]  En caso de inclemencias del [place], por favor llame a nuestra lnea principal al [phone] para una actualizacin sobre el estado de cualquier retraso o cierre.  Consejos para la medicacin en dermatologa: Por favor, guarde las cajas en las que vienen los medicamentos de uso tpico para ayudarle a seguir  las instrucciones sobre dnde y cmo usarlos. Las farmacias generalmente imprimen las instrucciones del medicamento slo en las cajas y no directamente en los tubos del [place].   Si su medicamento es muy caro, por favor, pngase en contacto con landry rieger llamando al [phone] y presione la opcin 4 o envenos un mensaje a travs de Clinical cytogeneticist.   No podemos decirle cul ser su copago por los medicamentos por adelantado ya que esto es diferente dependiendo de la cobertura de su seguro. Sin embargo, es posible que podamos encontrar un medicamento sustituto a Audiological scientist un formulario para que el seguro cubra el medicamento que se considera necesario.   Si se requiere una autorizacin previa para que su compaa de seguros malta su medicamento, por favor permtanos de 1 a 2 das hbiles para completar este proceso.  Los precios de los medicamentos varan con frecuencia dependiendo del Environmental consultant de dnde se surte la receta y alguna farmacias pueden ofrecer precios  ms baratos.  El sitio web www.goodrx.com tiene cupones para medicamentos de Health and safety inspector. Los precios aqu no tienen en cuenta lo que podra costar con la ayuda del seguro (puede ser ms barato con su seguro), pero el sitio web puede darle el precio si no utiliz Tourist information centre manager.  - Puede imprimir el cupn correspondiente y llevarlo con su receta a la farmacia.  - Tambin puede pasar por nuestra oficina durante el horario de atencin regular y Education officer, museum una tarjeta de cupones de GoodRx.  - Si necesita que su receta se enve electrnicamente a una farmacia diferente, informe a nuestra oficina a travs de MyChart de [place] o por telfono llamando al [phone] y presione la opcin 4.

## 2024-03-10 NOTE — Progress Notes (Signed)
" ° °  Follow Up Visit   Subjective  Kelly [name] is a 38 y.o. female who presents for the following: Rash  Rash on the left hand and left posterior thigh; started about 2 months ago. Has improved with OTC eczema cream, Triamcinolone , Clobetasol  but never resolved.   The following portions of the chart were reviewed this encounter and updated as appropriate: medications, allergies, medical history  Review of Systems:  No other skin or systemic complaints except as noted in HPI or Assessment and Plan.  Objective  Well appearing patient in no apparent distress; mood and affect are within normal limits.  A focused examination was performed of the following areas: Bilateral hands  Relevant exam findings are noted in the Assessment and Plan.    Assessment & Plan   HAND DERMATITIS Exam Scaly pink plaques +/- fissures on the left hand  Chronic and persistent condition with duration or expected duration over one year. Condition is symptomatic/ bothersome to patient. Not currently at goal.  Hand Dermatitis is a chronic type of eczema that can come and go on the hands and fingers.  While there is no cure, the rash and symptoms can be managed with topical prescription medications, and for more severe cases, with systemic medications.  Recommend mild soap and routine use of moisturizing cream after handwashing.  Minimize soap/water exposure when possible.    Treatment Plan - Start clobetasol  ointment 0.05% twice daily to affected skin      - Topical steroids (such as triamcinolone , fluocinolone, fluocinonide, mometasone, clobetasol , halobetasol, betamethasone , hydrocortisone) can cause thinning and lightening of the skin if they are used for too long in the same area. Your physician has selected the right strength medicine for your problem and area affected on the body. Please use your medication only as directed by your physician to prevent side effects.    Recommend mild soap and  moisturizing cream with hand washing.   HAND DERMATITIS   This Visit - clobetasol  ointment (TEMOVATE ) 0.05 % - Apply 1 gram topically to affected area of skin twice daily. Stop once resolved and restart as needed for flares. Avoid use on face, armpits, groin unless otherwise indicated.    Return if symptoms worsen or fail to improve.  I, Emerick Ege, CMA am acting as scribe for Boneta Sharps, MD.   Documentation: I have reviewed the above documentation for accuracy and completeness, and I agree with the above.  Boneta Sharps, MD  "

## 2024-03-30 ENCOUNTER — Ambulatory Visit (INDEPENDENT_AMBULATORY_CARE_PROVIDER_SITE_OTHER): Payer: Self-pay | Admitting: Dermatology

## 2024-03-30 DIAGNOSIS — L988 Other specified disorders of the skin and subcutaneous tissue: Secondary | ICD-10-CM

## 2024-03-30 NOTE — Progress Notes (Signed)
" ° °  Follow-Up Visit   Subjective  Marc Buch is a 39 y.o. female who presents for the following: Botox for facial elastosis  The following portions of the chart were reviewed this encounter and updated as appropriate: medications, allergies, medical history  Review of Systems:  No other skin or systemic complaints except as noted in HPI or Assessment and Plan.  Objective  Well appearing patient in no apparent distress; mood and affect are within normal limits.  A focused examination was performed of the face.  Relevant physical exam findings are noted in the Assessment and Plan.      Assessment & Plan    Facial Elastosis  Frown complex 20 units Forehead 8 Units   Location: See attached image  Informed consent: Discussed risks (infection, pain, bleeding, bruising, swelling, allergic reaction, paralysis of nearby muscles, eyelid droop, double vision, neck weakness, difficulty breathing, headache, undesirable cosmetic result, and need for additional treatment) and benefits of the procedure, as well as the alternatives.  Informed consent was obtained.  Preparation: The area was cleansed with alcohol.  Procedure Details:  Botox was injected into the dermis with a 30-gauge needle. Pressure applied to any bleeding. Ice packs offered for swelling.  Lot Number:  [id number] Expiration:  12/27  Total Units Injected:  28  Plan: Tylenol  may be used for headache.  Allow 2 weeks before returning to clinic for additional dosing as needed. Patient will call for any problems.  Return 4-5 months, for botox.  I, Emerick Ege, CMA am acting as scribe for Rexene Rattler, MD.   Documentation: I have reviewed the above documentation for accuracy and completeness, and I agree with the above.  Rexene Rattler, MD      "

## 2024-03-30 NOTE — Patient Instructions (Signed)

## 2024-06-30 ENCOUNTER — Ambulatory Visit: Admitting: Allergy & Immunology

## 2024-07-21 ENCOUNTER — Ambulatory Visit: Admitting: Dermatology

## 2024-07-27 ENCOUNTER — Ambulatory Visit: Admitting: Dermatology
